# Patient Record
Sex: Male | Born: 1980 | Race: White | Hispanic: No | Marital: Married | Smoking: Current every day smoker
Health system: Southern US, Community
[De-identification: ages and names within clinical notes are randomized; demographics above are authoritative.]

## PROBLEM LIST (undated history)

## (undated) DIAGNOSIS — Q600 Renal agenesis, unilateral: Secondary | ICD-10-CM

## (undated) DIAGNOSIS — M5412 Radiculopathy, cervical region: Secondary | ICD-10-CM

## (undated) DIAGNOSIS — M542 Cervicalgia: Secondary | ICD-10-CM

## (undated) DIAGNOSIS — M5416 Radiculopathy, lumbar region: Secondary | ICD-10-CM

## (undated) DIAGNOSIS — M549 Dorsalgia, unspecified: Secondary | ICD-10-CM

## (undated) DIAGNOSIS — G8929 Other chronic pain: Secondary | ICD-10-CM

---
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
## 2002-03-27 ENCOUNTER — Emergency Department (HOSPITAL_COMMUNITY): Admission: EM | Admit: 2002-03-27 | Discharge: 2002-03-27 | Payer: Self-pay | Admitting: Emergency Medicine

## 2002-03-27 ENCOUNTER — Encounter: Payer: Self-pay | Admitting: Emergency Medicine

## 2002-03-29 ENCOUNTER — Emergency Department (HOSPITAL_COMMUNITY): Admission: EM | Admit: 2002-03-29 | Discharge: 2002-03-29 | Payer: Self-pay | Admitting: Emergency Medicine

## 2002-06-17 ENCOUNTER — Emergency Department (HOSPITAL_COMMUNITY): Admission: EM | Admit: 2002-06-17 | Discharge: 2002-06-17 | Payer: Self-pay

## 2002-07-12 ENCOUNTER — Emergency Department (HOSPITAL_COMMUNITY): Admission: EM | Admit: 2002-07-12 | Discharge: 2002-07-12 | Payer: Self-pay | Admitting: Emergency Medicine

## 2002-08-28 ENCOUNTER — Emergency Department (HOSPITAL_COMMUNITY): Admission: EM | Admit: 2002-08-28 | Discharge: 2002-08-28 | Payer: Self-pay | Admitting: *Deleted

## 2002-09-02 ENCOUNTER — Emergency Department (HOSPITAL_COMMUNITY): Admission: EM | Admit: 2002-09-02 | Discharge: 2002-09-02 | Payer: Self-pay | Admitting: Emergency Medicine

## 2003-03-30 ENCOUNTER — Emergency Department (HOSPITAL_COMMUNITY): Admission: EM | Admit: 2003-03-30 | Discharge: 2003-03-30 | Payer: Self-pay | Admitting: Emergency Medicine

## 2004-02-21 ENCOUNTER — Emergency Department (HOSPITAL_COMMUNITY): Admission: EM | Admit: 2004-02-21 | Discharge: 2004-02-21 | Payer: Self-pay | Admitting: Emergency Medicine

## 2004-02-21 ENCOUNTER — Emergency Department (HOSPITAL_COMMUNITY): Admission: EM | Admit: 2004-02-21 | Discharge: 2004-02-22 | Payer: Self-pay | Admitting: Emergency Medicine

## 2004-12-26 ENCOUNTER — Emergency Department (HOSPITAL_COMMUNITY): Admission: EM | Admit: 2004-12-26 | Discharge: 2004-12-26 | Payer: Self-pay | Admitting: Emergency Medicine

## 2005-08-02 ENCOUNTER — Emergency Department (HOSPITAL_COMMUNITY): Admission: EM | Admit: 2005-08-02 | Discharge: 2005-08-02 | Payer: Self-pay | Admitting: Emergency Medicine

## 2005-08-10 ENCOUNTER — Ambulatory Visit: Payer: Self-pay | Admitting: Family Medicine

## 2005-08-10 ENCOUNTER — Ambulatory Visit (HOSPITAL_COMMUNITY): Admission: RE | Admit: 2005-08-10 | Discharge: 2005-08-10 | Payer: Self-pay | Admitting: Family Medicine

## 2005-08-12 ENCOUNTER — Ambulatory Visit (HOSPITAL_COMMUNITY): Admission: RE | Admit: 2005-08-12 | Discharge: 2005-08-12 | Payer: Self-pay | Admitting: Family Medicine

## 2005-08-25 ENCOUNTER — Emergency Department (HOSPITAL_COMMUNITY): Admission: EM | Admit: 2005-08-25 | Discharge: 2005-08-25 | Payer: Self-pay | Admitting: Emergency Medicine

## 2006-05-05 ENCOUNTER — Emergency Department (HOSPITAL_COMMUNITY): Admission: EM | Admit: 2006-05-05 | Discharge: 2006-05-05 | Payer: Self-pay | Admitting: Emergency Medicine

## 2007-04-25 ENCOUNTER — Emergency Department (HOSPITAL_COMMUNITY): Admission: EM | Admit: 2007-04-25 | Discharge: 2007-04-25 | Payer: Self-pay | Admitting: Emergency Medicine

## 2007-08-02 ENCOUNTER — Encounter: Payer: Self-pay | Admitting: Family Medicine

## 2010-01-04 ENCOUNTER — Emergency Department (HOSPITAL_COMMUNITY): Admission: EM | Admit: 2010-01-04 | Discharge: 2010-01-04 | Payer: Self-pay | Admitting: Emergency Medicine

## 2010-01-05 ENCOUNTER — Emergency Department (HOSPITAL_COMMUNITY): Admission: EM | Admit: 2010-01-05 | Discharge: 2010-01-05 | Payer: Self-pay | Admitting: Emergency Medicine

## 2010-06-21 ENCOUNTER — Emergency Department (HOSPITAL_COMMUNITY): Admission: EM | Admit: 2010-06-21 | Discharge: 2010-06-21 | Payer: Self-pay | Admitting: Emergency Medicine

## 2010-07-16 ENCOUNTER — Emergency Department (HOSPITAL_COMMUNITY)
Admission: EM | Admit: 2010-07-16 | Discharge: 2010-07-16 | Payer: Self-pay | Source: Home / Self Care | Admitting: Emergency Medicine

## 2010-08-24 ENCOUNTER — Emergency Department (HOSPITAL_COMMUNITY)
Admission: EM | Admit: 2010-08-24 | Discharge: 2010-08-24 | Payer: Self-pay | Source: Home / Self Care | Admitting: Emergency Medicine

## 2010-08-31 NOTE — Letter (Signed)
Summary: Historic Patient File  Historic Patient File   Imported By: Lind Guest 05/03/2010 15:48:47  _____________________________________________________________________  External Attachment:    Type:   Image     Comment:   External Document

## 2010-09-08 ENCOUNTER — Emergency Department (HOSPITAL_COMMUNITY)
Admission: EM | Admit: 2010-09-08 | Discharge: 2010-09-08 | Disposition: A | Discharge status: Home / Self Care | Payer: Self-pay | Attending: Emergency Medicine | Admitting: Emergency Medicine

## 2010-09-08 DIAGNOSIS — J069 Acute upper respiratory infection, unspecified: Secondary | ICD-10-CM | POA: Insufficient documentation

## 2010-09-08 DIAGNOSIS — R197 Diarrhea, unspecified: Secondary | ICD-10-CM | POA: Insufficient documentation

## 2010-09-08 DIAGNOSIS — R05 Cough: Secondary | ICD-10-CM | POA: Insufficient documentation

## 2010-09-08 DIAGNOSIS — J3489 Other specified disorders of nose and nasal sinuses: Secondary | ICD-10-CM | POA: Insufficient documentation

## 2010-09-08 DIAGNOSIS — R5381 Other malaise: Secondary | ICD-10-CM | POA: Insufficient documentation

## 2010-09-08 DIAGNOSIS — F17200 Nicotine dependence, unspecified, uncomplicated: Secondary | ICD-10-CM | POA: Insufficient documentation

## 2010-09-08 DIAGNOSIS — R059 Cough, unspecified: Secondary | ICD-10-CM | POA: Insufficient documentation

## 2010-09-08 DIAGNOSIS — R112 Nausea with vomiting, unspecified: Secondary | ICD-10-CM | POA: Insufficient documentation

## 2010-09-08 DIAGNOSIS — IMO0002 Reserved for concepts with insufficient information to code with codable children: Secondary | ICD-10-CM | POA: Insufficient documentation

## 2010-09-08 LAB — RAPID STREP SCREEN (MED CTR MEBANE ONLY): Streptococcus, Group A Screen (Direct): NEGATIVE

## 2010-10-12 LAB — DIFFERENTIAL
Basophils Absolute: 0.1 10*3/uL (ref 0.0–0.1)
Basophils Relative: 1 % (ref 0–1)
Eosinophils Absolute: 0.3 10*3/uL (ref 0.0–0.7)
Eosinophils Relative: 4 % (ref 0–5)
Lymphocytes Relative: 31 % (ref 12–46)
Monocytes Absolute: 0.4 10*3/uL (ref 0.1–1.0)

## 2010-10-12 LAB — POCT CARDIAC MARKERS
CKMB, poc: 1.2 ng/mL (ref 1.0–8.0)
Myoglobin, poc: 58.2 ng/mL (ref 12–200)

## 2010-10-12 LAB — LIPASE, BLOOD: Lipase: 26 U/L (ref 11–59)

## 2010-10-12 LAB — CBC
HCT: 46.9 % (ref 39.0–52.0)
Hemoglobin: 16.1 g/dL (ref 13.0–17.0)
MCV: 95.6 fL (ref 78.0–100.0)
RDW: 12.6 % (ref 11.5–15.5)
WBC: 6.9 10*3/uL (ref 4.0–10.5)

## 2010-10-12 LAB — COMPREHENSIVE METABOLIC PANEL
AST: 25 U/L (ref 0–37)
BUN: 6 mg/dL (ref 6–23)
CO2: 25 mEq/L (ref 19–32)
Chloride: 107 mEq/L (ref 96–112)
Creatinine, Ser: 0.76 mg/dL (ref 0.4–1.5)
Glucose, Bld: 95 mg/dL (ref 70–99)
Potassium: 4 mEq/L (ref 3.5–5.1)

## 2010-11-25 ENCOUNTER — Emergency Department (HOSPITAL_COMMUNITY)
Admission: EM | Admit: 2010-11-25 | Discharge: 2010-11-25 | Disposition: A | Discharge status: Home / Self Care | Payer: Self-pay | Attending: Emergency Medicine | Admitting: Emergency Medicine

## 2010-11-25 DIAGNOSIS — K089 Disorder of teeth and supporting structures, unspecified: Secondary | ICD-10-CM | POA: Insufficient documentation

## 2010-12-24 ENCOUNTER — Emergency Department (HOSPITAL_COMMUNITY)
Admission: EM | Admit: 2010-12-24 | Discharge: 2010-12-24 | Disposition: A | Discharge status: Home / Self Care | Payer: Self-pay | Attending: Emergency Medicine | Admitting: Emergency Medicine

## 2010-12-24 DIAGNOSIS — Q605 Renal hypoplasia, unspecified: Secondary | ICD-10-CM | POA: Insufficient documentation

## 2010-12-24 DIAGNOSIS — K089 Disorder of teeth and supporting structures, unspecified: Secondary | ICD-10-CM | POA: Insufficient documentation

## 2010-12-24 DIAGNOSIS — Q602 Renal agenesis, unspecified: Secondary | ICD-10-CM | POA: Insufficient documentation

## 2010-12-24 DIAGNOSIS — IMO0002 Reserved for concepts with insufficient information to code with codable children: Secondary | ICD-10-CM | POA: Insufficient documentation

## 2010-12-24 DIAGNOSIS — K029 Dental caries, unspecified: Secondary | ICD-10-CM | POA: Insufficient documentation

## 2011-01-23 ENCOUNTER — Emergency Department (HOSPITAL_COMMUNITY)
Admission: EM | Admit: 2011-01-23 | Discharge: 2011-01-23 | Disposition: A | Discharge status: Home / Self Care | Payer: Self-pay | Attending: Emergency Medicine | Admitting: Emergency Medicine

## 2011-01-23 DIAGNOSIS — J40 Bronchitis, not specified as acute or chronic: Secondary | ICD-10-CM | POA: Insufficient documentation

## 2011-01-23 DIAGNOSIS — F17200 Nicotine dependence, unspecified, uncomplicated: Secondary | ICD-10-CM | POA: Insufficient documentation

## 2011-01-23 DIAGNOSIS — J029 Acute pharyngitis, unspecified: Secondary | ICD-10-CM | POA: Insufficient documentation

## 2011-06-09 ENCOUNTER — Emergency Department (HOSPITAL_COMMUNITY)
Admission: EM | Admit: 2011-06-09 | Discharge: 2011-06-09 | Disposition: A | Discharge status: Home / Self Care | Payer: Medicaid Other | Attending: Emergency Medicine | Admitting: Emergency Medicine

## 2011-06-09 ENCOUNTER — Emergency Department (HOSPITAL_COMMUNITY): Payer: Medicaid Other

## 2011-06-09 ENCOUNTER — Encounter: Payer: Self-pay | Admitting: Emergency Medicine

## 2011-06-09 DIAGNOSIS — F17200 Nicotine dependence, unspecified, uncomplicated: Secondary | ICD-10-CM | POA: Insufficient documentation

## 2011-06-09 DIAGNOSIS — R509 Fever, unspecified: Secondary | ICD-10-CM | POA: Insufficient documentation

## 2011-06-09 DIAGNOSIS — M545 Low back pain, unspecified: Secondary | ICD-10-CM | POA: Insufficient documentation

## 2011-06-09 DIAGNOSIS — S335XXA Sprain of ligaments of lumbar spine, initial encounter: Secondary | ICD-10-CM | POA: Insufficient documentation

## 2011-06-09 DIAGNOSIS — W108XXA Fall (on) (from) other stairs and steps, initial encounter: Secondary | ICD-10-CM | POA: Insufficient documentation

## 2011-06-09 DIAGNOSIS — S39012A Strain of muscle, fascia and tendon of lower back, initial encounter: Secondary | ICD-10-CM

## 2011-06-09 DIAGNOSIS — S20229A Contusion of unspecified back wall of thorax, initial encounter: Secondary | ICD-10-CM

## 2011-06-09 DIAGNOSIS — R209 Unspecified disturbances of skin sensation: Secondary | ICD-10-CM | POA: Insufficient documentation

## 2011-06-09 LAB — URINALYSIS, ROUTINE W REFLEX MICROSCOPIC
Bilirubin Urine: NEGATIVE
Ketones, ur: NEGATIVE mg/dL
Specific Gravity, Urine: >1.03 — ABNORMAL HIGH (ref 1.005–1.030)
pH: 5.5 (ref 5.0–8.0)

## 2011-06-09 MED ORDER — METHOCARBAMOL 500 MG PO TABS: ORAL_TABLET | ORAL | Status: DC

## 2011-06-09 MED ORDER — DIAZEPAM 5 MG PO TABS
10.0000 mg | ORAL_TABLET | Freq: Once | ORAL | Status: AC
  Administered 2011-06-09: 10 mg via ORAL
  Filled 2011-06-09: qty 2

## 2011-06-09 MED ORDER — HYDROCODONE-ACETAMINOPHEN 5-325 MG PO TABS
2.0000 | ORAL_TABLET | Freq: Once | ORAL | Status: AC
  Administered 2011-06-09: 2 via ORAL
  Filled 2011-06-09: qty 2

## 2011-06-09 MED ORDER — HYDROCODONE-ACETAMINOPHEN 5-325 MG PO TABS: 1.0000 | ORAL_TABLET | ORAL | Status: AC | PRN

## 2011-06-09 MED ORDER — MELOXICAM 7.5 MG PO TABS: ORAL_TABLET | ORAL | Status: DC

## 2011-06-09 NOTE — ED Notes (Signed)
Pt states fell down stairs landing on buttocks at approx 1230. Pt states pain radiates down both legs and into scrotum.

## 2011-06-09 NOTE — ED Notes (Addendum)
Lower back pain running down both legs x 2 hrs. Took ibuprofen with no relief. States missed a step and fell backwards to back area. Denies hitting head or loc. NAD

## 2011-06-09 NOTE — ED Provider Notes (Signed)
History     CSN: [id number] Arrival date & time: 06/09/2011  3:20 PM   First MD Initiated Contact with Patient 06/09/11 1541      Chief Complaint  Patient presents with  . Back Pain    (Consider location/radiation/quality/duration/timing/severity/associated sxs/prior treatment) Patient is a 30 y.o. male presenting with back pain. The history is provided by the patient.  Back Pain  This is a new problem. The current episode started 6 to 12 hours ago. The problem occurs constantly. The problem has been gradually worsening. The pain is associated with falling. The pain is present in the lumbar spine. The pain radiates to the left foot and right foot. The pain is severe. The pain is the same all the time. Associated symptoms include a fever, pelvic pain and tingling. Pertinent negatives include no chest pain, no abdominal pain, no bowel incontinence, no perianal numbness, no bladder incontinence and no dysuria. He has tried NSAIDs for the symptoms.    History reviewed. No pertinent past medical history.  History reviewed. No pertinent past surgical history.  History reviewed. No pertinent family history.  History  Substance Use Topics  . Smoking status: Current Everyday Smoker  . Smokeless tobacco: Not on file  . Alcohol Use: No      Review of Systems  Constitutional: Positive for fever. Negative for activity change.       All ROS Neg except as noted in HPI  HENT: Negative for nosebleeds and neck pain.   Eyes: Negative for photophobia and discharge.  Respiratory: Negative for cough, shortness of breath and wheezing.   Cardiovascular: Negative for chest pain and palpitations.  Gastrointestinal: Negative for abdominal pain, blood in stool and bowel incontinence.  Genitourinary: Positive for pelvic pain. Negative for bladder incontinence, dysuria, frequency and hematuria.  Musculoskeletal: Positive for back pain. Negative for arthralgias.  Skin: Negative.   Neurological:  Positive for tingling. Negative for dizziness, seizures and speech difficulty.  Psychiatric/Behavioral: Negative for hallucinations and confusion.    Allergies  Review of patient's allergies indicates no known allergies.  Home Medications   Current Outpatient Rx  Name Route Sig Dispense Refill  . IBUPROFEN 200 MG PO TABS Oral Take 1,200 mg by mouth once as needed. For pain       BP 151/96  Pulse 104  Temp(Src) 99 F (37.2 C) (Oral)  Resp 16  Ht 6' (1.829 m)  Wt 270 lb (122.471 kg)  BMI 36.62 kg/m2  SpO2 99%  Physical Exam  Nursing note and vitals reviewed. Constitutional: He is oriented to person, place, and time. He appears well-developed and well-nourished.  Non-toxic appearance.  HENT:  Head: Normocephalic.  Right Ear: Tympanic membrane and external ear normal.  Left Ear: Tympanic membrane and external ear normal.  Eyes: EOM and lids are normal. Pupils are equal, round, and reactive to light.  Neck: Normal range of motion. Neck supple. Carotid bruit is not present.  Cardiovascular: Normal rate, regular rhythm, normal heart sounds, intact distal pulses and normal pulses.   Pulmonary/Chest: Breath sounds normal. No respiratory distress.  Abdominal: Soft. Bowel sounds are normal. There is no tenderness. There is no guarding.  Musculoskeletal: Normal range of motion.  Lymphadenopathy:       Head (right side): No submandibular adenopathy present.       Head (left side): No submandibular adenopathy present.    He has no cervical adenopathy.  Neurological: He is alert and oriented to person, place, and time. He has normal strength. No cranial  nerve deficit or sensory deficit.  Skin: Skin is warm and dry.  Psychiatric: He has a normal mood and affect. His speech is normal.    ED Course: Pain improving after pain meds. 5:22 made aware that the urine had been collected but not sent to the lab. Results pending. Test results give to patient. Pt states while sitting he is ok, but  moving is very uncomfortable. Pt to alternate heat and ice. Use crutches. Rx for Mobic, Robaxin and Norco given. Pt to f/u with Dr Hilda Lias if not improving.  Procedures (including critical care time)  Labs Reviewed - No data to display No results found.   Dx: Contusion of lower back   2. Lumbar strain.   MDM  I have reviewed nursing notes, vital signs, and all appropriate lab and imaging results for this patient.  Results for orders placed during the hospital encounter of 06/09/11  URINALYSIS, ROUTINE W REFLEX MICROSCOPIC      Component Value Range   Color, Urine YELLOW  YELLOW    Appearance CLEAR  CLEAR    Specific Gravity, Urine >1.030 (*) 1.005 - 1.030    pH 5.5  5.0 - 8.0    Glucose, UA NEGATIVE  NEGATIVE (mg/dL)   Hgb urine dipstick NEGATIVE  NEGATIVE    Bilirubin Urine NEGATIVE  NEGATIVE    Ketones, ur NEGATIVE  NEGATIVE (mg/dL)   Protein, ur NEGATIVE  NEGATIVE (mg/dL)   Urobilinogen, UA 1.0  0.0 - 1.0 (mg/dL)   Nitrite NEGATIVE  NEGATIVE    Leukocytes, UA NEGATIVE  NEGATIVE    Dg Lumbar Spine Complete  06/09/2011  *RADIOLOGY REPORT*  Clinical Data: History of injury from fall with low back pain.  LUMBAR SPINE - COMPLETE 4+ VIEW  Comparison: 04/06/2011.  Findings: There are five non-rib bearing lumbar-type vertebral bodies.  These are labeled L1-L5.  There is slight narrowing of the intervertebral disc space at L5-S1.  No fracture or subluxation is seen.  No pars defect is evident.  Minimal beginning degenerative spondylosis spurring is seen involving the anterior inferior aspect of the body of L5.  SI joints appear intact.  IMPRESSION: Minimal beginning degenerative spondylosis at L5-S1.  No fracture or subluxation evident.  Original Report Authenticated By: Crawford Givens, M.D.   Dg Sacrum/coccyx  06/09/2011  *RADIOLOGY REPORT*  Clinical Data: History of injury from fall with pain.  SACRUM AND COCCYX - 2+ VIEW  Comparison: None.  Findings: There is minimal degenerative  spondylosis.  Small phleboliths are seen.  SI joints appear intact. Alignment is normal.  Joint spaces are preserved.  No fracture or dislocation is evident.  No soft tissue lesions are seen.  IMPRESSION: No sacral or coccygeal fracture is evident.  Original Report Authenticated By: Crawford Givens, M.D.         Kathie Dike, [place] 06/09/11 [phone]

## 2011-06-10 NOTE — ED Provider Notes (Signed)
Medical screening examination/treatment/procedure(s) were performed by non-physician practitioner and as supervising physician I was immediately available for consultation/collaboration.  Flint Melter, MD 06/10/11 [phone]

## 2011-11-01 ENCOUNTER — Other Ambulatory Visit (HOSPITAL_COMMUNITY): Payer: Self-pay | Admitting: Neurosurgery

## 2011-11-01 DIAGNOSIS — M48061 Spinal stenosis, lumbar region without neurogenic claudication: Secondary | ICD-10-CM

## 2011-11-04 ENCOUNTER — Inpatient Hospital Stay (HOSPITAL_COMMUNITY): Admission: RE | Admit: 2011-11-04 | Payer: Medicaid Other | Source: Ambulatory Visit

## 2012-06-16 ENCOUNTER — Emergency Department (HOSPITAL_COMMUNITY)
Admission: EM | Admit: 2012-06-16 | Discharge: 2012-06-16 | Disposition: A | Discharge status: Home / Self Care | Payer: Medicaid Other | Attending: Emergency Medicine | Admitting: Emergency Medicine

## 2012-06-16 ENCOUNTER — Encounter (HOSPITAL_COMMUNITY): Payer: Self-pay

## 2012-06-16 DIAGNOSIS — K029 Dental caries, unspecified: Secondary | ICD-10-CM | POA: Insufficient documentation

## 2012-06-16 DIAGNOSIS — Q602 Renal agenesis, unspecified: Secondary | ICD-10-CM | POA: Insufficient documentation

## 2012-06-16 DIAGNOSIS — H9202 Otalgia, left ear: Secondary | ICD-10-CM

## 2012-06-16 DIAGNOSIS — H9209 Otalgia, unspecified ear: Secondary | ICD-10-CM | POA: Insufficient documentation

## 2012-06-16 DIAGNOSIS — F17200 Nicotine dependence, unspecified, uncomplicated: Secondary | ICD-10-CM | POA: Insufficient documentation

## 2012-06-16 DIAGNOSIS — Q605 Renal hypoplasia, unspecified: Secondary | ICD-10-CM | POA: Insufficient documentation

## 2012-06-16 HISTORY — DX: Renal agenesis, unilateral: Q60.0

## 2012-06-16 MED ORDER — HYDROCODONE-ACETAMINOPHEN 5-325 MG PO TABS: 1.0000 | ORAL_TABLET | ORAL | Status: AC | PRN

## 2012-06-16 MED ORDER — AMOXICILLIN 500 MG PO CAPS: 500.0000 mg | ORAL_CAPSULE | Freq: Three times a day (TID) | ORAL | Status: AC

## 2012-06-16 MED ORDER — HYDROCODONE-ACETAMINOPHEN 5-325 MG PO TABS
1.0000 | ORAL_TABLET | Freq: Once | ORAL | Status: AC
  Administered 2012-06-16: 1 via ORAL
  Filled 2012-06-16: qty 1

## 2012-06-16 MED ORDER — AMOXICILLIN 250 MG PO CAPS
500.0000 mg | ORAL_CAPSULE | Freq: Once | ORAL | Status: AC
  Administered 2012-06-16: 500 mg via ORAL
  Filled 2012-06-16 (×2): qty 1

## 2012-06-16 NOTE — ED Notes (Signed)
Left ear ache, started around 0400 this morning per pt.

## 2012-06-17 NOTE — ED Provider Notes (Signed)
History     CSN: [id number]  Arrival date & time 06/16/12  Ernestina Columbia   First MD Initiated Contact with Patient 06/16/12 1931      Chief Complaint  Patient presents with  . Otalgia    (Consider location/radiation/quality/duration/timing/severity/associated sxs/prior treatment) HPI Comments: COLLIN CLARA woke around 4 am today with pain in and around his left ear.  He denies fevers, chills, dizziness, decreased hearing acuity, drainage from and injury to the ear.  He also denies nasal congestion, sore throat and dental pain,  Although does have several decayed teeth that needs to be pulled.  He has taken ibuprofen without relief of his symptoms.  The history is provided by the patient.    Past Medical History  Diagnosis Date  . Congenital absence of one kidney     History reviewed. No pertinent past surgical history.  No family history on file.  History  Substance Use Topics  . Smoking status: Current Every Day Smoker  . Smokeless tobacco: Not on file  . Alcohol Use: No      Review of Systems  Constitutional: Negative for fever.  HENT: Positive for ear pain and dental problem. Negative for congestion, sore throat, facial swelling, rhinorrhea, neck pain, neck stiffness and sinus pressure.   Respiratory: Negative for shortness of breath.     Allergies  Review of patient's allergies indicates no known allergies.  Home Medications   Current Outpatient Rx  Name  Route  Sig  Dispense  Refill  . IBUPROFEN 800 MG PO TABS   Oral   Take 800 mg by mouth every 8 (eight) hours as needed. pain         . AMOXICILLIN 500 MG PO CAPS   Oral   Take 1 capsule (500 mg total) by mouth 3 (three) times daily.   30 capsule   0   . HYDROCODONE-ACETAMINOPHEN 5-325 MG PO TABS   Oral   Take 1 tablet by mouth every 4 (four) hours as needed for pain.   15 tablet   0   . MELOXICAM 7.5 MG PO TABS      1 po bid with food   12 tablet   0   . METHOCARBAMOL 500 MG PO TABS      2  po bid with food   28 tablet   0     BP 145/85  Pulse 88  Temp 98.1 F (36.7 C) (Oral)  Resp 18  Ht 6' (1.829 m)  Wt 280 lb (127.007 kg)  BMI 37.97 kg/m2  SpO2 100%  Physical Exam  Constitutional: He is oriented to person, place, and time. He appears well-developed and well-nourished. No distress.  HENT:  Head: Normocephalic and atraumatic.  Right Ear: Tympanic membrane, external ear and ear canal normal. No tenderness. No mastoid tenderness. Tympanic membrane is not erythematous and not bulging.  Left Ear: Tympanic membrane, external ear and ear canal normal. No tenderness. No mastoid tenderness. Tympanic membrane is not erythematous and not bulging.  Mouth/Throat: Oropharynx is clear and moist and mucous membranes are normal. No oral lesions.    Eyes: Conjunctivae normal are normal.  Neck: Normal range of motion. Neck supple.  Cardiovascular: Normal rate and normal heart sounds.   Pulmonary/Chest: Effort normal.  Abdominal: He exhibits no distension.  Musculoskeletal: Normal range of motion.  Lymphadenopathy:    He has no cervical adenopathy.  Neurological: He is alert and oriented to person, place, and time.  Skin: Skin is warm and dry.  No erythema.  Psychiatric: He has a normal mood and affect.    ED Course  Procedures (including critical care time)  Labs Reviewed - No data to display No results found.   1. Earache on left   2. Dental decay       MDM  Ear exam is normal.  Suspect pain is referred from left lower molar.  Will cover with amoxil, hydrocodone prescribed.  Dental referral given.        Burgess Amor, PA 06/17/12 1356  Burgess Amor, [place] 06/17/12 1357

## 2012-06-17 NOTE — ED Provider Notes (Signed)
Medical screening examination/treatment/procedure(s) were performed by non-physician practitioner and as supervising physician I was immediately available for consultation/collaboration.   Benny Lennert, MD 06/17/12 [phone]

## 2012-08-14 ENCOUNTER — Encounter (HOSPITAL_COMMUNITY): Payer: Self-pay | Admitting: *Deleted

## 2012-08-14 ENCOUNTER — Emergency Department (HOSPITAL_COMMUNITY)
Admission: EM | Admit: 2012-08-14 | Discharge: 2012-08-14 | Disposition: A | Discharge status: Home / Self Care | Payer: Medicaid Other | Attending: Emergency Medicine | Admitting: Emergency Medicine

## 2012-08-14 DIAGNOSIS — K0889 Other specified disorders of teeth and supporting structures: Secondary | ICD-10-CM

## 2012-08-14 DIAGNOSIS — R6884 Jaw pain: Secondary | ICD-10-CM | POA: Insufficient documentation

## 2012-08-14 DIAGNOSIS — Q602 Renal agenesis, unspecified: Secondary | ICD-10-CM | POA: Insufficient documentation

## 2012-08-14 DIAGNOSIS — K089 Disorder of teeth and supporting structures, unspecified: Secondary | ICD-10-CM | POA: Insufficient documentation

## 2012-08-14 DIAGNOSIS — Q605 Renal hypoplasia, unspecified: Secondary | ICD-10-CM | POA: Insufficient documentation

## 2012-08-14 DIAGNOSIS — F17200 Nicotine dependence, unspecified, uncomplicated: Secondary | ICD-10-CM | POA: Insufficient documentation

## 2012-08-14 MED ORDER — TRAMADOL HCL 50 MG PO TABS
100.0000 mg | ORAL_TABLET | Freq: Four times a day (QID) | ORAL | Status: DC
  Administered 2012-08-14: 100 mg via ORAL
  Filled 2012-08-14: qty 2

## 2012-08-14 MED ORDER — IBUPROFEN 800 MG PO TABS
800.0000 mg | ORAL_TABLET | Freq: Once | ORAL | Status: AC
  Administered 2012-08-14: 800 mg via ORAL
  Filled 2012-08-14: qty 1

## 2012-08-14 MED ORDER — TRAMADOL HCL 50 MG PO TABS: ORAL_TABLET | ORAL | Status: DC

## 2012-08-14 MED ORDER — PENICILLIN V POTASSIUM 500 MG PO TABS: 500.0000 mg | ORAL_TABLET | Freq: Four times a day (QID) | ORAL | Status: AC

## 2012-08-14 MED ORDER — PENICILLIN V POTASSIUM 250 MG PO TABS
500.0000 mg | ORAL_TABLET | Freq: Once | ORAL | Status: AC
  Administered 2012-08-14: 500 mg via ORAL
  Filled 2012-08-14: qty 2

## 2012-08-14 NOTE — ED Provider Notes (Signed)
History     CSN: [id number]  Arrival date & time 08/14/12  1314   First MD Initiated Contact with Patient 08/14/12 1343      Chief Complaint  Patient presents with  . Dental Pain    (Consider location/radiation/quality/duration/timing/severity/associated sxs/prior treatment) HPI Comments: Has no dentist.  Patient is a 32 y.o. male presenting with tooth pain. The history is provided by the patient. No language interpreter was used.  Dental PainPrimary symptoms do not include dental injury or fever. Episode onset: began hurting while eating dinner 2 nights ago. The symptoms are unchanged. The symptoms occur constantly.  Additional symptoms include: jaw pain. Additional symptoms do not include: purulent gums, trismus and facial swelling.    Past Medical History  Diagnosis Date  . Congenital absence of one kidney     History reviewed. No pertinent past surgical history.  History reviewed. No pertinent family history.  History  Substance Use Topics  . Smoking status: Current Every Day Smoker  . Smokeless tobacco: Not on file  . Alcohol Use: No      Review of Systems  Constitutional: Negative for fever and chills.  HENT: Positive for dental problem. Negative for facial swelling.   All other systems reviewed and are negative.    Allergies  Hydrocodone  Home Medications   Current Outpatient Rx  Name  Route  Sig  Dispense  Refill  . IBUPROFEN 200 MG PO TABS   Oral   Take 800 mg by mouth every 6 (six) hours as needed. Pain         . PENICILLIN V POTASSIUM 500 MG PO TABS   Oral   Take 1 tablet (500 mg total) by mouth 4 (four) times daily.   40 tablet   0   . TRAMADOL HCL 50 MG PO TABS      2 tabs po TID prn pain   30 tablet   0     BP 142/99  Pulse 80  Temp 98.7 F (37.1 C) (Oral)  Resp 18  Ht 6' (1.829 m)  Wt 280 lb (127.007 kg)  BMI 37.97 kg/m2  SpO2 96%  Physical Exam  Nursing note and vitals reviewed. Constitutional: He is oriented to  person, place, and time. He appears well-developed and well-nourished.  HENT:  Head: Normocephalic and atraumatic.  Mouth/Throat: Uvula is midline, oropharynx is clear and moist and mucous membranes are normal. Dental caries present. No uvula swelling.    Eyes: EOM are normal.  Neck: Normal range of motion.  Cardiovascular: Normal rate, regular rhythm and intact distal pulses.   Pulmonary/Chest: Effort normal. No respiratory distress.  Abdominal: Soft. He exhibits no distension. There is no tenderness.  Musculoskeletal: Normal range of motion.  Neurological: He is alert and oriented to person, place, and time.  Skin: Skin is warm and dry.  Psychiatric: He has a normal mood and affect. Judgment normal.    ED Course  Procedures (including critical care time)  Labs Reviewed - No data to display No results found.   1. Pain, dental       MDM  rx-tramadol, 30 Ibuprofen rxpen VK 500 mg QID, 40 F/u with dentist ASAP        Evalina Field, PA 08/14/12 1452  Evalina Field, PA 08/14/12 1453

## 2012-08-14 NOTE — ED Notes (Signed)
Dental pain rt mandibular molar.for 2 days

## 2012-08-14 NOTE — Discharge Instructions (Signed)
Dental Pain A tooth ache may be caused by cavities (tooth decay). Cavities expose the nerve of the tooth to air and hot or cold temperatures. It may come from an infection or abscess (also called a boil or furuncle) around your tooth. It is also often caused by dental caries (tooth decay). This causes the pain you are having. DIAGNOSIS  Your caregiver can diagnose this problem by exam. TREATMENT   If caused by an infection, it may be treated with medications which kill germs (antibiotics) and pain medications as prescribed by your caregiver. Take medications as directed.  Only take over-the-counter or prescription medicines for pain, discomfort, or fever as directed by your caregiver.  Whether the tooth ache today is caused by infection or dental disease, you should see your dentist as soon as possible for further care. SEEK MEDICAL CARE IF: The exam and treatment you received today has been provided on an emergency basis only. This is not a substitute for complete medical or dental care. If your problem worsens or new problems (symptoms) appear, and you are unable to meet with your dentist, call or return to this location. SEEK IMMEDIATE MEDICAL CARE IF:   You have a fever.  You develop redness and swelling of your face, jaw, or neck.  You are unable to open your mouth.  You have severe pain uncontrolled by pain medicine. MAKE SURE YOU:   Understand these instructions.  Will watch your condition.  Will get help right away if you are not doing well or get worse. Document Released: 07/18/2005 Document Revised: 10/10/2011 Document Reviewed: 03/05/2008 [facility] Patient Information 2013 [place], [place].   Take the meds as directed.  Take ibuprofen 800 mg every 8 hrs  With food.  Follow up with the dentist of your choice.

## 2012-08-15 NOTE — ED Provider Notes (Signed)
Medical screening examination/treatment/procedure(s) were performed by non-physician practitioner and as supervising physician I was immediately available for consultation/collaboration.   Charles B. Sheldon, MD 08/15/12 0851 

## 2013-03-01 ENCOUNTER — Encounter (HOSPITAL_COMMUNITY): Payer: Self-pay

## 2013-03-01 ENCOUNTER — Emergency Department (HOSPITAL_COMMUNITY)
Admission: EM | Admit: 2013-03-01 | Discharge: 2013-03-02 | Disposition: A | Discharge status: Home / Self Care | Payer: Medicaid Other | Attending: Emergency Medicine | Admitting: Emergency Medicine

## 2013-03-01 DIAGNOSIS — Q605 Renal hypoplasia, unspecified: Secondary | ICD-10-CM | POA: Insufficient documentation

## 2013-03-01 DIAGNOSIS — R112 Nausea with vomiting, unspecified: Secondary | ICD-10-CM | POA: Insufficient documentation

## 2013-03-01 DIAGNOSIS — R42 Dizziness and giddiness: Secondary | ICD-10-CM | POA: Insufficient documentation

## 2013-03-01 DIAGNOSIS — Q602 Renal agenesis, unspecified: Secondary | ICD-10-CM | POA: Insufficient documentation

## 2013-03-01 DIAGNOSIS — F17200 Nicotine dependence, unspecified, uncomplicated: Secondary | ICD-10-CM | POA: Insufficient documentation

## 2013-03-01 DIAGNOSIS — R109 Unspecified abdominal pain: Secondary | ICD-10-CM

## 2013-03-01 LAB — URINALYSIS, ROUTINE W REFLEX MICROSCOPIC
Leukocytes, UA: NEGATIVE
Nitrite: NEGATIVE
Protein, ur: NEGATIVE mg/dL
Urobilinogen, UA: 0.2 mg/dL (ref 0.0–1.0)

## 2013-03-01 LAB — CBC WITH DIFFERENTIAL/PLATELET
Basophils Absolute: 0.1 10*3/uL (ref 0.0–0.1)
Eosinophils Relative: 3 % (ref 0–5)
Lymphocytes Relative: 24 % (ref 12–46)
Neutro Abs: 8.4 10*3/uL — ABNORMAL HIGH (ref 1.7–7.7)
Platelets: 241 10*3/uL (ref 150–400)
RDW: 12.6 % (ref 11.5–15.5)
WBC: 12.3 10*3/uL — ABNORMAL HIGH (ref 4.0–10.5)

## 2013-03-01 MED ORDER — MORPHINE SULFATE 4 MG/ML IJ SOLN
4.0000 mg | Freq: Once | INTRAMUSCULAR | Status: AC
  Administered 2013-03-01: 4 mg via INTRAVENOUS
  Filled 2013-03-01: qty 1

## 2013-03-01 MED ORDER — ONDANSETRON HCL 4 MG/2ML IJ SOLN
4.0000 mg | Freq: Once | INTRAMUSCULAR | Status: AC
  Administered 2013-03-01: 4 mg via INTRAVENOUS
  Filled 2013-03-01: qty 2

## 2013-03-01 NOTE — ED Provider Notes (Deleted)
  CSN: [id number]     Arrival date & time 03/01/13  2203 History     First MD Initiated Contact with Patient 03/01/13 2241     Chief Complaint  Patient presents with  . Flank Pain  . Dizziness  . Emesis   (Consider location/radiation/quality/duration/timing/severity/associated sxs/prior Treatment) HPI Comments: RUTILIO YELLOWHAIR is a 32 y.o. Male presenting with left upper quadrant pain, intermittent episodes of lightheadedness, chills followed by hot flushes and nausea with only one episode of emesis(last night).  He reports drinking multiple shots of tequila,  Then took an herbal male enhancement product called Stamina RX last night, with symptoms starting about 1 hour later. He denies chest pain and shortness of breath,  Although he has discomfort in his LUQ with deep inspiration.  He has been able to tolerate PO intake today without worsened symptoms.  He denies diarrhea, hematemesis, dysuria,  Flank or back pain.  He has taken no medicines for relief of symptoms.  He has taken herbal male enhancers in the past but never this brand.  He does not know the active ingredients.     The history is provided by the patient.    Past Medical History  Diagnosis Date  . Congenital absence of one kidney    History reviewed. No pertinent past surgical history. History reviewed. No pertinent family history. History  Substance Use Topics  . Smoking status: Current Every Day Smoker  . Smokeless tobacco: Not on file  . Alcohol Use: No    Review of Systems  Allergies  Hydrocodone and Tramadol  Home Medications   Current Outpatient Rx  Name  Route  Sig  Dispense  Refill  . OVER THE COUNTER MEDICATION   Oral   Take 2 tablets by mouth once as needed (STAMINA RX:).         Marland Kitchen OVER THE COUNTER MEDICATION   Oral   Take 1 capsule by mouth daily as needed (for supplement (Multivitamin-Chinese Remedy)).          BP 131/66  Pulse 101  Temp(Src) 98.7 F (37.1 C) (Oral)  Resp 16  Ht 6'  (1.829 m)  Wt 280 lb (127.007 kg)  BMI 37.97 kg/m2  SpO2 97% Physical Exam  ED Course   Procedures (including critical care time)  Labs Reviewed  URINALYSIS, ROUTINE W REFLEX MICROSCOPIC - Abnormal; Notable for the following:    Specific Gravity, Urine >1.030 (*)    Bilirubin Urine SMALL (*)    All other components within normal limits  CBC WITH DIFFERENTIAL  COMPREHENSIVE METABOLIC PANEL  LIPASE, BLOOD   No results found. No diagnosis found.  MDM  Per the website for Stamina Rx,  Ingredients include Gena Fray Extract,Cnidium Extract, Yohimbe Extract.  Main complaints of side effects per customer complaint include headache and nausea.   Patients labs and/or radiological studies were viewed and considered during the medical decision making and disposition process. Pt discussed with Dr. Rubin Payor.  Ct abd/pelvis ordered to further eval RUQ pain,  ? Early pancreatitis despite normal lipase. Dr. Rubin Payor will follow pt pending this study.  Burgess Amor, PA-C 03/02/13 0106

## 2013-03-01 NOTE — ED Notes (Signed)
Pt is unable to urinate at this time urinal placed in room for pt. Johnny Shepherd

## 2013-03-01 NOTE — ED Notes (Signed)
Sharp pain in my left side, hard to breathe at times, was vomiting last night per pt. Feel dizzy headed at times.

## 2013-03-01 NOTE — ED Notes (Addendum)
Pt states he started taking Stamina RX and a chinese supplement, started having pain after starting these medications.

## 2013-03-02 ENCOUNTER — Emergency Department (HOSPITAL_COMMUNITY): Payer: Medicaid Other

## 2013-03-02 LAB — COMPREHENSIVE METABOLIC PANEL
ALT: 52 U/L (ref 0–53)
AST: 33 U/L (ref 0–37)
CO2: 25 mEq/L (ref 19–32)
Calcium: 9.3 mg/dL (ref 8.4–10.5)
GFR calc non Af Amer: >90 mL/min (ref >90)
Sodium: 137 mEq/L (ref 135–145)

## 2013-03-02 MED ORDER — SODIUM CHLORIDE 0.9 % IV BOLUS (SEPSIS)
1000.0000 mL | Freq: Once | INTRAVENOUS | Status: AC
  Administered 2013-03-02: 1000 mL via INTRAVENOUS

## 2013-03-02 MED ORDER — IOHEXOL 300 MG/ML  SOLN
50.0000 mL | Freq: Once | INTRAMUSCULAR | Status: AC | PRN
  Administered 2013-03-02: 50 mL via ORAL

## 2013-03-02 MED ORDER — IOHEXOL 300 MG/ML  SOLN
100.0000 mL | Freq: Once | INTRAMUSCULAR | Status: AC | PRN
  Administered 2013-03-02: 100 mL via INTRAVENOUS

## 2013-03-02 NOTE — ED Provider Notes (Signed)
Medical screening examination/treatment/procedure(s) were conducted as a shared visit with non-physician practitioner(s) and myself.  I personally evaluated the patient during the encounter. Patient with abdominal flank pain. CT reassuring except for panic mass the patient will follow. Will be discharged home  Juliet Rude. Rubin Payor, MD 03/02/13 [phone]

## 2013-03-02 NOTE — ED Notes (Signed)
Discharge instructions reviewed with pt, questions answered. Pt verbalized understanding.  

## 2013-03-08 NOTE — ED Provider Notes (Signed)
CSN: [id number]     Arrival date & time 03/01/13  2203 History     First MD Initiated Contact with Patient 03/01/13 2241     Chief Complaint  Patient presents with  . Flank Pain  . Dizziness  . Emesis   (Consider location/radiation/quality/duration/timing/severity/associated sxs/prior Treatment) HPI Comments: Johnny Shepherd is a 32 y.o. Male presenting with left upper quadrant pain, intermittent episodes of lightheadedness, chills followed by hot flushes and nausea with only one episode of emesis(last night).  He reports drinking multiple shots of tequila, Then took an herbal male enhancement product called Stamina RX last night, with symptoms starting about 1 hour later. He denies chest pain and shortness of breath,  Although he has discomfort in his LUQ with deep inspiration.  He has been able to tolerate PO intake today without worsened symptoms.  He denies diarrhea, hematemesis, dysuria,  Flank or back pain.  He has taken no medicines for relief of symptoms.  He has taken herbal male enhancers in the past but never this brand.  He does not know the active ingredients.   The history is provided by the patient.    Past Medical History  Diagnosis Date  . Congenital absence of one kidney    History reviewed. No pertinent past surgical history. History reviewed. No pertinent family history. History  Substance Use Topics  . Smoking status: Current Every Day Smoker  . Smokeless tobacco: Not on file  . Alcohol Use: No    Review of Systems  Constitutional: Negative for fever and chills.       Flushing   HENT: Negative for congestion, sore throat and neck pain.   Eyes: Negative.   Respiratory: Negative for cough, chest tightness, shortness of breath and wheezing.   Cardiovascular: Negative for chest pain.  Gastrointestinal: Positive for nausea, vomiting and abdominal pain.  Genitourinary: Negative.   Musculoskeletal: Negative for joint swelling and arthralgias.  Skin: Negative.   Negative for rash and wound.  Neurological: Negative for dizziness, weakness, light-headedness, numbness and headaches.  Psychiatric/Behavioral: Negative.     Allergies  Hydrocodone and Tramadol  Home Medications   Current Outpatient Rx  Name  Route  Sig  Dispense  Refill  . OVER THE COUNTER MEDICATION   Oral   Take 2 tablets by mouth once as needed (STAMINA RX:).         Marland Kitchen OVER THE COUNTER MEDICATION   Oral   Take 1 capsule by mouth daily as needed (for supplement (Multivitamin-Chinese Remedy)).          BP 138/64  Pulse 82  Temp(Src) 98.7 F (37.1 C) (Oral)  Resp 20  Ht 6' (1.829 m)  Wt 280 lb (127.007 kg)  BMI 37.97 kg/m2  SpO2 96% Physical Exam  Nursing note and vitals reviewed. Constitutional: He appears well-developed and well-nourished.  HENT:  Head: Normocephalic and atraumatic.  Eyes: Conjunctivae are normal.  Neck: Normal range of motion.  Cardiovascular: Normal rate, regular rhythm, normal heart sounds and intact distal pulses.   Pulmonary/Chest: Effort normal and breath sounds normal. He has no wheezes.  Abdominal: Soft. Bowel sounds are normal. There is tenderness in the left upper quadrant. There is no rebound, no guarding and no CVA tenderness.  Musculoskeletal: Normal range of motion.  Neurological: He is alert.  Skin: Skin is warm and dry.  Psychiatric: He has a normal mood and affect.    ED Course   Procedures (including critical care time)  Labs Reviewed  URINALYSIS,  ROUTINE W REFLEX MICROSCOPIC - Abnormal; Notable for the following:    Specific Gravity, Urine >1.030 (*)    Bilirubin Urine SMALL (*)    All other components within normal limits  CBC WITH DIFFERENTIAL - Abnormal; Notable for the following:    WBC 12.3 (*)    Hemoglobin 18.3 (*)    MCH 34.2 (*)    MCHC 37.0 (*)    Neutro Abs 8.4 (*)    All other components within normal limits  COMPREHENSIVE METABOLIC PANEL - Abnormal; Notable for the following:    Potassium 3.3 (*)     Glucose, Bld 172 (*)    All other components within normal limits  LIPASE, BLOOD   No results found. 1. Flank pain   2. Nausea and vomiting     MDM  Per the website for Stamina Rx, Ingredients include Gena Fray Extract,Cnidium Extract, Yohimbe Extract. Main complaints of side effects per customer complaint include headache and nausea.  Patients labs and/or radiological studies were viewed and considered during the medical decision making and disposition process.  Pt discussed with Dr. Rubin Payor. Ct abd/pelvis ordered to further eval RUQ pain, ? Early pancreatitis despite normal lipase. Dr. Rubin Payor will follow pt pending this study.   Burgess Amor, PA-C 03/08/13 1725

## 2013-03-11 NOTE — ED Provider Notes (Signed)
Medical screening examination/treatment/procedure(s) were performed by non-physician practitioner and as supervising physician I was immediately available for consultation/collaboration.  Livio Ledwith R. Mattheu Brodersen, MD 03/11/13 1210 

## 2013-04-17 ENCOUNTER — Encounter (HOSPITAL_COMMUNITY): Payer: Self-pay

## 2013-04-17 ENCOUNTER — Emergency Department (HOSPITAL_COMMUNITY)
Admission: EM | Admit: 2013-04-17 | Discharge: 2013-04-17 | Disposition: A | Discharge status: Home / Self Care | Payer: Medicaid Other | Attending: Emergency Medicine | Admitting: Emergency Medicine

## 2013-04-17 DIAGNOSIS — M542 Cervicalgia: Secondary | ICD-10-CM | POA: Insufficient documentation

## 2013-04-17 DIAGNOSIS — H9202 Otalgia, left ear: Secondary | ICD-10-CM

## 2013-04-17 DIAGNOSIS — H9209 Otalgia, unspecified ear: Secondary | ICD-10-CM | POA: Insufficient documentation

## 2013-04-17 DIAGNOSIS — R51 Headache: Secondary | ICD-10-CM | POA: Insufficient documentation

## 2013-04-17 DIAGNOSIS — Z905 Acquired absence of kidney: Secondary | ICD-10-CM | POA: Insufficient documentation

## 2013-04-17 DIAGNOSIS — F17200 Nicotine dependence, unspecified, uncomplicated: Secondary | ICD-10-CM | POA: Insufficient documentation

## 2013-04-17 MED ORDER — KETOROLAC TROMETHAMINE 60 MG/2ML IM SOLN
30.0000 mg | Freq: Once | INTRAMUSCULAR | Status: AC
  Administered 2013-04-17: 30 mg via INTRAMUSCULAR
  Filled 2013-04-17: qty 2

## 2013-04-17 NOTE — ED Notes (Signed)
Pt reports left earache and headache since yesterday.

## 2013-04-17 NOTE — ED Provider Notes (Signed)
CSN: [id number]     Arrival date & time 04/17/13  0708 History  This chart was scribed for Johnny Churn, MD by Quintella Reichert, ED scribe.  This patient was seen in room APA11/APA11 and the patient's care was started at 7:27 AM.   Chief Complaint  Patient presents with  . Otalgia  . Headache    Patient is a 32 y.o. male presenting with ear pain. The history is provided by the patient. No language interpreter was used.  Otalgia Location:  Left Behind ear:  No abnormality Severity:  Moderate Onset quality:  Gradual Duration: "a few days" Timing:  Constant Progression:  Worsening Chronicity:  New Worsened by:  Coughing Ineffective treatments: ibuprofen. Associated symptoms: headaches (left-sided) and neck pain (left-sided)   Associated symptoms: no congestion, no diarrhea, no fever, no sore throat and no vomiting   Risk factors: no chronic ear infection     HPI Comments: Johnny Shepherd is a 32 y.o. male with no pertinent medical history who presents to the Emergency Department complaining of constant, gradual-onset, progressively-worsening left ear pain with associated headache and neck pain.  Pt states that his left ear has been mildly painful for "a few days" and has felt "stopped up."  Last night his pain grew more severe and spread to the left side of his neck.  He attempted to treat pain with ibuprofen, but his pain continued to grow more severe throughout the night.  Early this morning his pain spread to the left side of his head.  Pt also states he has been coughing some and "when I do it feels like my head's gonna explode."  He denies recent illness.  He denies fever, congestion, sore throat, difficulty swallowing, difficulty breathing, vomiting, diarrhea, or dental pain.     Past Medical History  Diagnosis Date  . Congenital absence of one kidney    History reviewed. No pertinent past surgical history.  No family history on file.  History  Substance Use Topics  .  Smoking status: Current Every Day Smoker    Types: Cigarettes  . Smokeless tobacco: Not on file  . Alcohol Use: No     Review of Systems  Constitutional: Negative for fever.  HENT: Positive for ear pain and neck pain (left-sided). Negative for congestion, sore throat, trouble swallowing and dental problem.   Respiratory: Negative for shortness of breath.   Gastrointestinal: Negative for vomiting and diarrhea.  Neurological: Positive for headaches (left-sided).  All other systems reviewed and are negative.     Allergies  Hydrocodone and Tramadol  Home Medications   Current Outpatient Rx  Name  Route  Sig  Dispense  Refill  . OVER THE COUNTER MEDICATION   Oral   Take 2 tablets by mouth once as needed (STAMINA RX:).         Johnny Shepherd OVER THE COUNTER MEDICATION   Oral   Take 1 capsule by mouth daily as needed (for supplement (Multivitamin-Chinese Remedy)).          BP 129/88  Pulse 69  Temp(Src) 97.7 F (36.5 C) (Oral)  Resp 20  Ht 6' (1.829 m)  Wt 280 lb (127.007 kg)  BMI 37.97 kg/m2  SpO2 99%  Physical Exam  Nursing note and vitals reviewed. Constitutional: He is oriented to person, place, and time. He appears well-developed and well-nourished. No distress.  HENT:  Head: Normocephalic and atraumatic.  Right Ear: Tympanic membrane and ear canal normal.  Left Ear: Tympanic membrane and ear canal  normal.  Mouth/Throat: Oropharynx is clear and moist and mucous membranes are normal. No oral lesions. No trismus in the jaw.  No dental tenderness  Eyes: Conjunctivae are normal. No scleral icterus.  Neck: Neck supple.  Cardiovascular: Normal rate, regular rhythm, normal heart sounds and intact distal pulses.   No murmur heard. Pulmonary/Chest: Effort normal and breath sounds normal. No stridor. No respiratory distress. He has no wheezes. He has no rales.  Abdominal: Normal appearance. He exhibits no distension.  Neurological: He is alert and oriented to person, place, and  time.  Skin: Skin is warm and dry. No rash noted.  Psychiatric: He has a normal mood and affect. His behavior is normal.    ED Course  Procedures (including critical care time)  DIAGNOSTIC STUDIES: Oxygen Saturation is 99% on room air, normal by my interpretation.    COORDINATION OF CARE: 7:34 AM-Informed pt that symptoms are likely due to self-limited infection.  Discussed treatment plan which includes ibuprofen, Tylenol, cold packs, decongestants, and proper hydration with pt at bedside and pt agreed to plan.    Labs Review Labs Reviewed - No data to display  Imaging Review No results found.  MDM   1. Otalgia, left    32 year old male with left-sided hearing facial pain. Nontoxic, no distress, normal vitals. Ear and facial exam unremarkable. Symptoms likely from viral upper extremity infection. Advised supportive care. No signs of bacterial infection. No signs of meningitis. Ketorolac IM for pain.    I personally performed the services described in this documentation, which was scribed in my presence. The recorded information has been reviewed and is accurate.     Johnny Churn, MD 04/17/13 [phone]

## 2013-10-26 ENCOUNTER — Emergency Department (HOSPITAL_COMMUNITY): Payer: Medicaid Other

## 2013-10-26 ENCOUNTER — Encounter (HOSPITAL_COMMUNITY): Payer: Self-pay | Admitting: Emergency Medicine

## 2013-10-26 ENCOUNTER — Observation Stay (HOSPITAL_COMMUNITY)
Admission: EM | Admit: 2013-10-26 | Discharge: 2013-10-27 | Disposition: A | Discharge status: Home / Self Care | Payer: Medicaid Other | Attending: Internal Medicine | Admitting: Internal Medicine

## 2013-10-26 DIAGNOSIS — M5126 Other intervertebral disc displacement, lumbar region: Secondary | ICD-10-CM | POA: Insufficient documentation

## 2013-10-26 DIAGNOSIS — R531 Weakness: Secondary | ICD-10-CM

## 2013-10-26 DIAGNOSIS — R5383 Other fatigue: Secondary | ICD-10-CM

## 2013-10-26 DIAGNOSIS — R29898 Other symptoms and signs involving the musculoskeletal system: Secondary | ICD-10-CM

## 2013-10-26 DIAGNOSIS — R5381 Other malaise: Secondary | ICD-10-CM

## 2013-10-26 DIAGNOSIS — M5412 Radiculopathy, cervical region: Principal | ICD-10-CM

## 2013-10-26 DIAGNOSIS — Z720 Tobacco use: Secondary | ICD-10-CM | POA: Diagnosis present

## 2013-10-26 DIAGNOSIS — F17200 Nicotine dependence, unspecified, uncomplicated: Secondary | ICD-10-CM | POA: Insufficient documentation

## 2013-10-26 LAB — CBC WITH DIFFERENTIAL/PLATELET
BASOS ABS: 0 10*3/uL (ref 0.0–0.1)
Basophils Relative: 1 % (ref 0–1)
Eosinophils Absolute: 0.3 10*3/uL (ref 0.0–0.7)
Eosinophils Relative: 3 % (ref 0–5)
HCT: 47.9 % (ref 39.0–52.0)
Hemoglobin: 16.6 g/dL (ref 13.0–17.0)
LYMPHS ABS: 2.2 10*3/uL (ref 0.7–4.0)
LYMPHS PCT: 28 % (ref 12–46)
MCH: 32.4 pg (ref 26.0–34.0)
MCHC: 34.7 g/dL (ref 30.0–36.0)
MCV: 93.4 fL (ref 78.0–100.0)
Monocytes Absolute: 0.5 10*3/uL (ref 0.1–1.0)
Monocytes Relative: 6 % (ref 3–12)
NEUTROS ABS: 4.8 10*3/uL (ref 1.7–7.7)
NEUTROS PCT: 62 % (ref 43–77)
PLATELETS: 224 10*3/uL (ref 150–400)
RBC: 5.13 MIL/uL (ref 4.22–5.81)
RDW: 12.8 % (ref 11.5–15.5)
WBC: 7.8 10*3/uL (ref 4.0–10.5)

## 2013-10-26 LAB — BASIC METABOLIC PANEL
BUN: 10 mg/dL (ref 6–23)
CALCIUM: 9.5 mg/dL (ref 8.4–10.5)
CO2: 28 mEq/L (ref 19–32)
CREATININE: 1.14 mg/dL (ref 0.50–1.35)
Chloride: 101 mEq/L (ref 96–112)
GFR calc non Af Amer: 85 mL/min — ABNORMAL LOW (ref >90)
Glucose, Bld: 100 mg/dL — ABNORMAL HIGH (ref 70–99)
Potassium: 4 mEq/L (ref 3.7–5.3)
SODIUM: 139 meq/L (ref 137–147)

## 2013-10-26 MED ORDER — SENNOSIDES-DOCUSATE SODIUM 8.6-50 MG PO TABS: 1.0000 | ORAL_TABLET | Freq: Every evening | ORAL | Status: DC | PRN

## 2013-10-26 MED ORDER — NAPROXEN SODIUM 275 MG PO TABS: 440.0000 mg | ORAL_TABLET | Freq: Every day | ORAL | Status: DC | PRN

## 2013-10-26 MED ORDER — NAPROXEN SODIUM 275 MG PO TABS
440.0000 mg | ORAL_TABLET | Freq: Every day | ORAL | Status: DC | PRN
  Filled 2013-10-26: qty 2

## 2013-10-26 MED ORDER — NAPROXEN 500 MG PO TABS
500.0000 mg | ORAL_TABLET | Freq: Every day | ORAL | Status: DC | PRN
  Administered 2013-10-26: 500 mg via ORAL
  Filled 2013-10-26: qty 1

## 2013-10-26 MED ORDER — IBUPROFEN 800 MG PO TABS
800.0000 mg | ORAL_TABLET | Freq: Once | ORAL | Status: AC
  Administered 2013-10-26: 800 mg via ORAL
  Filled 2013-10-26: qty 1

## 2013-10-26 MED ORDER — ASPIRIN 325 MG PO TABS
325.0000 mg | ORAL_TABLET | Freq: Every day | ORAL | Status: DC
  Administered 2013-10-27: 325 mg via ORAL
  Filled 2013-10-26: qty 1

## 2013-10-26 MED ORDER — ASPIRIN 300 MG RE SUPP
300.0000 mg | Freq: Every day | RECTAL | Status: DC
  Filled 2013-10-26: qty 1

## 2013-10-26 NOTE — Evaluation (Signed)
Received call from Dr. Welton FlakesKhan (AP hospitalist). Pt with ? LUE hemiparesis since earlier today. At [facility]. EDP spoke with teleneurologist. Recommend admission for stroke work [facility] at [facility] ([facility] not available on weekends [facility] Stroke order set placed by Dr. Welton FlakesKhan. [facility]. Neuro to consult on arrival.

## 2013-10-26 NOTE — ED Provider Notes (Signed)
CSN: [id number]     Arrival date & time 10/26/13  1529 History  This chart was scribed for Johnny Hutching, MD by Dorothey Baseman, ED Scribe. This patient was seen in room APA17/APA17 and the patient's care was started at 4:16 PM.    Chief Complaint  Patient presents with  . Extremity Weakness   The history is provided by the patient. No language interpreter was used.   HPI Comments: Johnny Shepherd is a 33 y.o. male who presents to the Emergency Department complaining of numbness and weakness to the left arm (patient states that he is unable to lift or move the arm) with associated dizziness and difficulty speaking onset this morning, around 5 hours ago, upon waking from sleep. He reports an associated, diffuse headache onset last night. He reports taking Aleve at home without significant relief of the headache. Patient reports that he has been ambulatory. Patient has no other pertinent medical history. Patient is a current, every day smoker and an occasional, social drinker. He denies any other drug use. Patient reports that he does not currently have a PCP.   Past Medical History  Diagnosis Date  . Congenital absence of one kidney    History reviewed. No pertinent past surgical history. No family history on file. History  Substance Use Topics  . Smoking status: Current Every Day Smoker    Types: Cigarettes  . Smokeless tobacco: Not on file  . Alcohol Use: No    Review of Systems  A complete 10 system review of systems was obtained and all systems are negative except as noted in the HPI and PMH.    Allergies  Hydrocodone and Tramadol  Home Medications   Current Outpatient Rx  Name  Route  Sig  Dispense  Refill  . naproxen sodium (ALEVE) 220 MG tablet   Oral   Take 440 mg by mouth daily as needed. pain          Triage Vitals: BP 145/93  Pulse 86  Temp(Src) 98 F (36.7 C) (Oral)  Resp 20  Ht 6' (1.829 m)  Wt 280 lb (127.007 kg)  BMI 37.97 kg/m2  SpO2 99%  Physical Exam   Nursing note and vitals reviewed. Constitutional: He appears well-developed and well-nourished.  HENT:  Head: Normocephalic and atraumatic.  Right Ear: Hearing, tympanic membrane, external ear and ear canal normal.  Left Ear: Hearing, tympanic membrane, external ear and ear canal normal.  Mouth/Throat: Oropharynx is clear and moist.  Eyes: Conjunctivae and EOM are normal. Pupils are equal, round, and reactive to light.  Neck: Normal range of motion. Neck supple.  Cardiovascular: Normal rate, regular rhythm and normal heart sounds.   Pulmonary/Chest: Effort normal and breath sounds normal.  Abdominal: Soft. Bowel sounds are normal.  Musculoskeletal: Normal range of motion.  Patient is not moving the left arm.   Neurological: He is alert.  Sensation is intact of the left arm. Patient is alert and oriented to person and place, but was unable to recall the day correctly. Normal strength of the bilateral, lower extremities.   Skin: Skin is warm and dry.  Psychiatric: He has a normal mood and affect. His behavior is normal.    ED Course  Procedures (including critical care time)  DIAGNOSTIC STUDIES: Oxygen Saturation is 99% on room air, normal by my interpretation.    COORDINATION OF CARE: 4:21 PM- Ordered a CT of the head and discussed that results were negative. Will consult to neurology. Will order blood labs (  CBC, BMP) and an EKG. Will order ibuprofen to manage symptoms. Discussed treatment plan with patient at bedside and patient verbalized agreement.   6:12 PM- Consulted with neurology.   Results for orders placed during the hospital encounter of 10/26/13  BASIC METABOLIC PANEL      Result Value Ref Range   Sodium 139  137 - 147 mEq/L   Potassium 4.0  3.7 - 5.3 mEq/L   Chloride 101  96 - 112 mEq/L   CO2 28  19 - 32 mEq/L   Glucose, Bld 100 (*) 70 - 99 mg/dL   BUN 10  6 - 23 mg/dL   Creatinine, Ser 1.61  0.50 - 1.35 mg/dL   Calcium 9.5  8.4 - 09.6 mg/dL   GFR calc non Af  Amer 85 (*) >90 mL/min   GFR calc Af Amer >90  >90 mL/min  CBC WITH DIFFERENTIAL      Result Value Ref Range   WBC 7.8  4.0 - 10.5 K/uL   RBC 5.13  4.22 - 5.81 MIL/uL   Hemoglobin 16.6  13.0 - 17.0 g/dL   HCT 04.5  40.9 - 81.1 %   MCV 93.4  78.0 - 100.0 fL   MCH 32.4  26.0 - 34.0 pg   MCHC 34.7  30.0 - 36.0 g/dL   RDW 91.4  78.2 - 95.6 %   Platelets 224  150 - 400 K/uL   Neutrophils Relative % 62  43 - 77 %   Neutro Abs 4.8  1.7 - 7.7 K/uL   Lymphocytes Relative 28  12 - 46 %   Lymphs Abs 2.2  0.7 - 4.0 K/uL   Monocytes Relative 6  3 - 12 %   Monocytes Absolute 0.5  0.1 - 1.0 K/uL   Eosinophils Relative 3  0 - 5 %   Eosinophils Absolute 0.3  0.0 - 0.7 K/uL   Basophils Relative 1  0 - 1 %   Basophils Absolute 0.0  0.0 - 0.1 K/uL   Ct Head Wo Contrast  10/26/2013   CLINICAL DATA:  Left arm numbness for 5 hr.  EXAM: CT HEAD WITHOUT CONTRAST  TECHNIQUE: Contiguous axial images were obtained from the base of the skull through the vertex without intravenous contrast.  COMPARISON:  12/26/2004  FINDINGS: Ventricles are normal in size and configuration. There are no parenchymal masses or mass effect. There are no areas of abnormal parenchymal attenuation. There is no evidence of an infarct.  There are no extra-axial masses or abnormal fluid collections.  There is no intracranial hemorrhage.  There is mild mucosal thickening in the ethmoid, sphenoid and right frontal sinuses. Clear mastoid air cells.  IMPRESSION: 1. No intracranial abnormality. 2. Mild sinus mucosal thickening.   Electronically Signed   By: Amie Portland M.D.   On: 10/26/2013 16:04    EKG Interpretation   Date/Time:  Saturday October 26 2013 17:02:34 EDT Ventricular Rate:  88 PR Interval:  140 QRS Duration: 102 QT Interval:  378 QTC Calculation: 457 R Axis:   71 Text Interpretation:  Normal sinus rhythm Normal ECG When compared with  ECG of 21-Jun-2010 08:36, No significant change was found Confirmed by  Julissa Browning  MD, Viney Acocella  [phone]) on 10/26/2013 5:46:54 PM     CRITICAL CARE Performed by: Johnny Shepherd Total critical care time: 30 Critical care time was exclusive of separately billable procedures and treating other patients. Critical care was necessary to treat or prevent imminent or life-threatening deterioration. Critical care was  time spent personally by me on the following activities: development of treatment plan with patient and/or surrogate as well as nursing, discussions with consultants, evaluation of patient's response to treatment, examination of patient, obtaining history from patient or surrogate, ordering and performing treatments and interventions, ordering and review of laboratory studies, ordering and review of radiographic studies, pulse oximetry and re-evaluation of patient's condition. MDM   Final diagnoses:  Left arm weakness  Uncertain etiology of left arm weakness.  Tele neurology consult obtained.   Neurologist rendered opinion that patient needs MRI of brain with contrast, MRA of head and neck, CT scan of abdomen/ pelvis.  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    I personally performed the services described in this documentation, which was scribed in my presence. The recorded information has been reviewed and is accurate.      Johnny HutchingBrian Alayna Mabe, MD 10/26/13 [phone]

## 2013-10-26 NOTE — Progress Notes (Signed)
Pt arrived to 4N09 and was able to walk into the room off of the stretcher. The weakness in his left arm has resolved. He is AAOx4. Telemetry was placed, vital signs were taken and the call bell is by his side. His wife is at the bedside. He was oriented to the room and lying comfortably in bed. Will continue to monitor. Tavon Magnussen, [place] ScrapeSarah E

## 2013-10-26 NOTE — ED Notes (Signed)
teleneurology completed.

## 2013-10-26 NOTE — ED Notes (Signed)
Pt states he had a headache last night. States he took two [place] and went to bed around 0400. States he woke up at 1100 and his left arm was numb and he could not move it.

## 2013-10-26 NOTE — ED Notes (Signed)
Pt drove himself to the ED. States he wife and kids were at a birthday party

## 2013-10-26 NOTE — H&P (Signed)
[facility] History and Physical  Johnny Shepherd:[id number] DOB: 03/26/1981 DOA: 10/26/2013  Referring physician: [name], MD PCP: No PCP Per Patient   Chief Complaint: Weakness in Left Arm  HPI: Johnny Shepherd is a 33 y.o. male presents to the ED with sudden onset of weakness in the left arm. He states that he found the arm to be weak this morning. He states associated with this was numbness. He has not had any prior findings like this. He states that he does have some issues with his shoulder in the past. He has also got a history of lumbar spine problems and had an MRI in 2007 showing a narrow canal with mild stenosis at L4-5 and moderately large central and left sided disc protrusion L5-S1 compressing the left S1 root. Currently he states that he is having a little pain in the lower back also. He denies any LOC and has a headache. No prior history of migraines. Denies having chest pain presently. Denies any seizure activity.    Review of Systems:  Constitutional:  No weight loss, night sweats, Fevers, chills, fatigue.  HEENT:  No headaches, Difficulty swallowing,Sore throat,  No sneezing, itching, ear ache, nasal congestion, post nasal drip,  Cardio-vascular:  No chest pain, Orthopnea, PND, swelling in lower extremities, anasarca, dizziness, palpitations  GI:  No heartburn, indigestion, abdominal pain, nausea, vomiting, diarrhea, change in bowel habits, loss of appetite  Resp:  No shortness of breath with exertion or at rest. No excess mucus, no productive cough, No non-productive cough, No coughing up of blood.No change in color of mucus.No wheezing.No chest wall deformity  Skin:  no rash or lesions.  GU:  no dysuria, change in color of urine, no urgency or frequency. No flank pain.  Musculoskeletal:  ++decreased range of motion on left arm. ++back pain.  Psych:  No change in mood or affect. No depression or anxiety. No memory loss.   Past Medical History    Diagnosis Date  . Congenital absence of one kidney    History reviewed. No pertinent past surgical history. Social History:  reports that he has been smoking Cigarettes.  He has been smoking about 0.00 packs per day. He does not have any smokeless tobacco history on file. He reports that he does not drink alcohol or use illicit drugs.  Allergies  Allergen Reactions  . Hydrocodone Itching  . Tramadol Nausea And Vomiting    No family history on file.   Prior to Admission medications   Medication Sig Start Date End Date Taking? Authorizing Provider  naproxen sodium (ALEVE) 220 MG tablet Take 440 mg by mouth daily as needed. pain   Yes Historical Provider, MD   Physical Exam: Filed Vitals:   10/26/13 1823  BP: 129/62  Pulse: 79  Temp:   Resp: 18    BP 129/62  Pulse 79  Temp(Src) 98 F (36.7 C) (Oral)  Resp 18  Ht 6' (1.829 m)  Wt 127.007 kg (280 lb)  BMI 37.97 kg/m2  SpO2 97%  General:  Appears calm and comfortable Eyes: PERRL, normal lids, irises & conjunctiva ENT: grossly normal hearing, lips & tongue Neck: no LAD, masses or thyromegaly Cardiovascular: RRR, no m/r/g. No LE edema. Telemetry: SR, no arrhythmias  Respiratory: CTA bilaterally, no w/r/r. Normal respiratory effort. Abdomen: soft, ntnd Skin: no rash or induration seen on limited exam Musculoskeletal: normal ROM on right side. Left arm decreased ability to lift. Tone and resistance is felt however on passive movements.  He is not able to voluntarily lift the arm Psychiatric: grossly normal mood and affect, speech fluent and appropriate Neurologic: LUE weakness states he is unable to move          Labs on Admission:  Basic Metabolic Panel:  Recent Labs Lab 10/26/13 1637  NA 139  K 4.0  CL 101  CO2 28  GLUCOSE 100*  BUN 10  CREATININE 1.14  CALCIUM 9.5   Liver Function Tests: No results found for this basename: AST, ALT, ALKPHOS, BILITOT, PROT, ALBUMIN,  in the last 168 hours No results found  for this basename: LIPASE, AMYLASE,  in the last 168 hours No results found for this basename: AMMONIA,  in the last 168 hours CBC:  Recent Labs Lab 10/26/13 1637  WBC 7.8  NEUTROABS 4.8  HGB 16.6  HCT 47.9  MCV 93.4  PLT 224   Cardiac Enzymes: No results found for this basename: CKTOTAL, CKMB, CKMBINDEX, TROPONINI,  in the last 168 hours  BNP (last 3 results) No results found for this basename: PROBNP,  in the last 8760 hours CBG: No results found for this basename: GLUCAP,  in the last 168 hours  Radiological Exams on Admission: Ct Head Wo Contrast  10/26/2013   CLINICAL DATA:  Left arm numbness for 5 hr.  EXAM: CT HEAD WITHOUT CONTRAST  TECHNIQUE: Contiguous axial images were obtained from the base of the skull through the vertex without intravenous contrast.  COMPARISON:  12/26/2004  FINDINGS: Ventricles are normal in size and configuration. There are no parenchymal masses or mass effect. There are no areas of abnormal parenchymal attenuation. There is no evidence of an infarct.  There are no extra-axial masses or abnormal fluid collections.  There is no intracranial hemorrhage.  There is mild mucosal thickening in the ethmoid, sphenoid and right frontal sinuses. Clear mastoid air cells.  IMPRESSION: 1. No intracranial abnormality. 2. Mild sinus mucosal thickening.   Electronically Signed   By: Amie Portlandavid  Ormond M.D.   On: 10/26/2013 16:04   MRI LUMBAR SPINE 08/12/2005  MR Lumbar Spine Wo Contrast Status: Final result         PACS Images    Show images for MR Lumbar Spine Wo Contrast         Study Result    Clinical data: Low back pain. Bilateral leg pain.  MRI LUMBAR SPINE WITHOUT CONTRAST:  Technique: Multiplanar and multiecho pulse sequences of the lumbar spine, to include the lower thoracic and upper sacral regions, were obtained according to standard protocol without IV contrast.  Comparison: No comparison.  Findings: Normal alignment. No fracture. The conus  medullaris is normal. No bony lesion is identified.  L1-2: Negative.  L2-3: Negative.  L3-4: There is mild congenital lumbar stenosis with mild facet arthropathy.  L4-5: Disc bulging and mild facet disease are present. There is mild to moderate spinal stenosis due to congenital and acquired stenosis.  L5-S1: There is a moderately large central disc protrusion eccentric to the left. This is compressed in the left S1 nerve root. It is also touching the right S1 root. There is mild facet arthropathy.  IMPRESSION:  Congenitally a small lumbar canal. There is mild stenosis at L4-5.  Moderately large central and left sided disc protrusion L5-S1 compressing the left S1 root.  Provider: Elizebeth BrookingShane Ellis     Assessment/Plan Active Problems:   Weakness of left arm   Weakness   Tobacco abuse   1. Weakness of Left Arm -unclear etiology. -Consultation by  ED physician to neurology they recommend a MRI and MRA for the weakness -will transfer to [facility] accepting Physician Andrena Mews, MD  2. Tobacco Use -counseled on smoking cessation  3. History of lumbar pain -has minor back pain -results of MRI noted  Code Status: Full Code(must indicate code status--if unknown or must be presumed, indicate so) Family Communication: Wife present in room (indicate person spoken with, if applicable, with phone number if by telephone) Disposition Plan: Transfer to [facility] (indicate anticipated LOS)  Time spent:  [facility] Pager [phone]

## 2013-10-27 ENCOUNTER — Observation Stay (HOSPITAL_COMMUNITY): Payer: Medicaid Other

## 2013-10-27 DIAGNOSIS — R29898 Other symptoms and signs involving the musculoskeletal system: Secondary | ICD-10-CM

## 2013-10-27 DIAGNOSIS — F17200 Nicotine dependence, unspecified, uncomplicated: Secondary | ICD-10-CM

## 2013-10-27 DIAGNOSIS — R209 Unspecified disturbances of skin sensation: Secondary | ICD-10-CM

## 2013-10-27 DIAGNOSIS — M5412 Radiculopathy, cervical region: Secondary | ICD-10-CM | POA: Diagnosis present

## 2013-10-27 LAB — GLUCOSE, CAPILLARY
GLUCOSE-CAPILLARY: 98 mg/dL (ref 70–99)
Glucose-Capillary: 126 mg/dL — ABNORMAL HIGH (ref 70–99)

## 2013-10-27 LAB — LIPID PANEL
CHOL/HDL RATIO: 5 ratio
Cholesterol: 166 mg/dL (ref 0–200)
HDL: 33 mg/dL — AB (ref >39)
LDL CALC: 73 mg/dL (ref 0–99)
Triglycerides: 302 mg/dL — ABNORMAL HIGH (ref <150)
VLDL: 60 mg/dL — AB (ref 0–40)

## 2013-10-27 LAB — HEMOGLOBIN A1C
HEMOGLOBIN A1C: 5.4 % (ref <5.7)
Mean Plasma Glucose: 108 mg/dL (ref <117)

## 2013-10-27 NOTE — Progress Notes (Signed)
VASCULAR LAB PRELIMINARY  PRELIMINARY  PRELIMINARY  PRELIMINARY  Carotid Dopplers completed.    Preliminary report:  1-39% ICA stenosis.  Vertebral artery flow is antegrade.  Amarien Carne, RVT 10/27/2013, 10:36 AM

## 2013-10-27 NOTE — Progress Notes (Signed)
OT Cancellation Note  Patient Details Name: Johnny Shepherd MRN: [id number] DOB: May 01, 1981   Cancelled Treatment:    Reason Eval/Treat Not Completed: OT screened, no needs identified, will sign off.  PT notified OT that pt has no OT needs.   Johnny Shepherd, Johnny Shepherd [phone] 10/27/2013, 11:49 AM

## 2013-10-27 NOTE — Progress Notes (Signed)
Pt inquired while in MRI if we were going to do his Liver MRI as well. I told pt I would check into this as well. I did see where a MRI was suggested on CT. I am posting this to pass along pt's inquiry and will call pt's RN as well. Johnny Shepherd , RT,R-MR

## 2013-10-27 NOTE — Evaluation (Signed)
Physical Therapy Evaluation Patient Details Name: Johnny SimpsonDewey R Shepherd MRN: [id number] DOB: 1981/07/24 Today's Date: 10/27/2013   History of Present Illness  Patient is a 33 yo male admitted with LUE numbness/weakness - resolved per patient.  Clinical Impression  Patient is independent in all mobility and gait.  Able to negotiate stairs independently.  Provided patient and wife with education on warning signs and risk factors for stroke.  Voiced understanding.  No further acute PT needs - PT will sign off.    Follow Up Recommendations  No PT follow-up    Equipment Recommendations    None recommended by PT   Recommendations for Other Services       Precautions / Restrictions Precautions Precautions: None Restrictions Weight Bearing Restrictions: No      Mobility  Bed Mobility Overal bed mobility: Independent                Transfers Overall transfer level: Independent Equipment used: None             General transfer comment: Good static standing balance  Ambulation/Gait Ambulation/Gait assistance: Independent Ambulation Distance (Feet): 200 Feet Assistive device: None Gait Pattern/deviations: WFL(Within Functional Limits) Gait velocity: WFL Gait velocity interpretation: at or above normal speed for age/gender General Gait Details: Patient with good gait pattern, balance, and speed.  Stairs Stairs: Yes Stairs assistance: Independent Stair Management: No rails;Alternating pattern;Forwards Number of Stairs: 4 General stair comments: Patient independent with negotiating stairs  Wheelchair Mobility    Modified Rankin (Stroke Patients Only) Modified Rankin (Stroke Patients Only) Pre-Morbid Rankin Score: No symptoms Modified Rankin: No symptoms     Balance Overall balance assessment: Independent  DGI balance assessment:  Scored 24/24                                 Pertinent Vitals/Pain     Home Living Family/patient expects to be  discharged to:: Private residence Living Arrangements: Spouse/significant other Available Help at Discharge: Family;Available 24 hours/day Type of Home: House Home Access: Stairs to enter Entrance Stairs-Rails: Doctor, general practiceight;Left Entrance Stairs-Number of Steps: 5 Home Layout: One level Home Equipment: None      Prior Function Level of Independence: Independent               Hand Dominance   Dominant Hand: Right    Extremity/Trunk Assessment   Upper Extremity Assessment: Overall WFL for tasks assessed           Lower Extremity Assessment: Overall WFL for tasks assessed (Patient reports Lt knee "gives him trouble")      Cervical / Trunk Assessment: Normal  Communication   Communication: No difficulties  Cognition Arousal/Alertness: Awake/alert Behavior During Therapy: WFL for tasks assessed/performed Overall Cognitive Status: Within Functional Limits for tasks assessed                      General Comments      Exercises        Assessment/Plan    PT Assessment Patent does not need any further PT services  PT Diagnosis     PT Problem List    PT Treatment Interventions     PT Goals (Current goals can be found in the Care Plan section)      Frequency     Barriers to discharge        End of Session Equipment Utilized During Treatment: Gait belt Activity Tolerance: Patient tolerated treatment  well Patient left: in bed;with call bell/phone within reach;with family/visitor present    Functional Assessment Tool Used: Clinical judgement; DGI balance assessment Functional Limitation: Mobility: Walking and moving around Mobility: Walking and Moving Around Current Status [phone]): 0 percent impaired, limited or restricted Mobility: Walking and Moving Around Goal Status [phone]): 0 percent impaired, limited or restricted Mobility: Walking and Moving Around Discharge Status [phone]): 0 percent impaired, limited or restricted    Time: 9147-8295 PT Time  Calculation (min): 19 min   Charges:   PT Evaluation $Initial PT Evaluation Tier I: 1 Procedure PT Treatments $Gait Training: 8-22 mins   PT G Codes:   Functional Assessment Tool Used: Clinical judgement; DGI balance assessment Functional Limitation: Mobility: Walking and moving around    Vena Austria 10/27/2013, 9:55 AM  Durenda Hurt. Renaldo Fiddler, [facility] Pager [phone]

## 2013-10-27 NOTE — Discharge Summary (Signed)
Physician Discharge Summary  Johnny Shepherd ZOX:[id number] DOB: [birth year] DOA: 10/26/2013  PCP: No PCP Per Patient  Admit date: 10/26/2013 Discharge date: 10/27/2013  Time spent: 35 minutes  Recommendations for Outpatient Follow-up:  1. Please followup on 13 mm hypodense lesion within the posterior right liver, seen on CT scan of abdomen and pelvis on 03/02/2013. Radiology reporting this is nonspecific. Radiology recommended ultrasound or MRI. On this hospitalization I recommended an MRI of liver for further workup prior to discharge, however, patient expressed his desire to be discharged this morning and follow up with a primary care provider to schedule outpatient imaging.  2. Prior to discharge case manager consulted to set patient up with PCP for hospital followup   Discharge Diagnoses:  Principal Problem:   Cervical radiculopathy Active Problems:   Weakness of left arm   Tobacco abuse   Discharge Condition: Stable  Diet recommendation: Heart Healthy  Filed Weights   10/26/13 1548  Weight: 127.007 kg (280 lb)    History of present illness:  Johnny Shepherd is a 33 y.o. male presents to the ED with sudden onset of weakness in the left arm. He states that he found the arm to be weak this morning. He states associated with this was numbness. He has not had any prior findings like this. He states that he does have some issues with his shoulder in the past. He has also got a history of lumbar spine problems and had an MRI in 2007 showing a narrow canal with mild stenosis at L4-5 and moderately large central and left sided disc protrusion L5-S1 compressing the left S1 root. Currently he states that he is having a little pain in the lower back also. He denies any LOC and has a headache. No prior history of migraines. Denies having chest pain presently. Denies any seizure activity.  Hospital Course:  Patient is a 33 year old gentleman with past medical history of DJD of the back, presented  with complaints of left arm pain and associated numbness. Presented as a transfer from [facility] for further workup of his weakness and numbness. He had an MRI of brain performed on 10/27/2013 which did not show CVA, radiology reporting normal noncontrast MRI appearance of the brain. Intracranial MRA was negative as well. Patient reporting intermittent episodes of numbness tingling as well as weakness to his left upper extremity since admission. He did report some neck pain, I suspect symptoms may be related to cervical radiculopathy. Patient having a CT scan of abdomen and pelvis on 03/02/2013 which showed a 13 mm hypodense lesion felt to be nonspecific. Prior to discharge I recommend an MRI of liver further workup however he expresses desire to continue workup in the outpatient setting asking to be discharged this morning. Prior to discharge case manager consulted to set patient up with PCP for hospital followup.   Discharge Exam: Filed Vitals:   10/27/13 0900  BP: 137/75  Pulse: 64  Temp: 97.9 F (36.6 C)  Resp: 18    General: Patient is in no acute distress, awake alert oriented, reports resolution to left upper extremity numbness and weakness Cardiovascular: Regular rate rhythm normal S1-S2 Respiratory: Clear to auscultation bilaterally no wheezing rhonchi or rales Abdomen: Soft nontender nondistended Neurological. Patient having nonfocal neurologic examination, cranial nerves II through XII are grossly intact. He has 5 of 5 muscle strength to bilateral upper and lower extremities without alteration to sensation.  Discharge Instructions  Discharge Orders   Future Orders Complete By Expires  Call MD for:  difficulty breathing, headache or visual disturbances  As directed    Call MD for:  extreme fatigue  As directed    Call MD for:  persistant dizziness or light-headedness  As directed    Call MD for:  persistant nausea and vomiting  As directed    Call MD for:  severe  uncontrolled pain  As directed    Call MD for:  temperature >100.4  As directed    Diet - low sodium heart healthy  As directed    Increase activity slowly  As directed        Medication List         ALEVE 220 MG tablet  Generic drug:  naproxen sodium  Take 440 mg by mouth daily as needed. pain       Allergies  Allergen Reactions  . Hydrocodone Itching  . Tramadol Nausea And Vomiting       Follow-up Information   Follow up with No PCP Per Patient.   Specialty:  General Practice       The results of significant diagnostics from this hospitalization (including imaging, microbiology, ancillary and laboratory) are listed below for reference.    Significant Diagnostic Studies: Dg Chest 2 View  10/27/2013   CLINICAL DATA:  Stroke.  EXAM: CHEST - 2 VIEW  COMPARISON:  CT ABD - PELV W/ CM dated 03/02/2013; DG CHEST 1V PORT dated 06/21/2010  FINDINGS: The heart size and mediastinal contours are within normal limits. There is no evidence of pulmonary edema, consolidation, pneumothorax, nodule or pleural fluid. The visualized skeletal structures are unremarkable.  IMPRESSION: No active disease.   Electronically Signed   By: Irish Lack M.D.   On: 10/27/2013 08:42   Ct Head Wo Contrast  10/26/2013   CLINICAL DATA:  Left arm numbness for 5 hr.  EXAM: CT HEAD WITHOUT CONTRAST  TECHNIQUE: Contiguous axial images were obtained from the base of the skull through the vertex without intravenous contrast.  COMPARISON:  12/26/2004  FINDINGS: Ventricles are normal in size and configuration. There are no parenchymal masses or mass effect. There are no areas of abnormal parenchymal attenuation. There is no evidence of an infarct.  There are no extra-axial masses or abnormal fluid collections.  There is no intracranial hemorrhage.  There is mild mucosal thickening in the ethmoid, sphenoid and right frontal sinuses. Clear mastoid air cells.  IMPRESSION: 1. No intracranial abnormality. 2. Mild sinus  mucosal thickening.   Electronically Signed   By: Amie Portland M.D.   On: 10/26/2013 16:04   Mr Brain Wo Contrast  10/27/2013   CLINICAL DATA:  33 year old male with sudden onset left upper extremity weakness and numbness. Initial encounter.  EXAM: MRI HEAD WITHOUT CONTRAST  MRA HEAD WITHOUT CONTRAST  TECHNIQUE: Multiplanar, multiecho pulse sequences of the brain and surrounding structures were obtained without intravenous contrast. Angiographic images of the head were obtained using MRA technique without contrast.  COMPARISON:  Head CTs 10/26/2013 and earlier.  FINDINGS: MRI HEAD FINDINGS  Study is intermittently degraded by motion artifact despite repeated imaging attempts.  Cerebral volume is normal. No restricted diffusion to suggest acute infarction. No midline shift, mass effect, evidence of mass lesion, ventriculomegaly, extra-axial collection or acute intracranial hemorrhage. Cervicomedullary junction and pituitary are within normal limits. Negative visualized cervical spine. Major intracranial vascular flow voids are preserved, dominant distal left vertebral artery.  Wallace Cullens and white matter signal is within normal limits throughout the brain.  Mild paranasal sinus  mucosal thickening. Visualized orbit soft tissues are within normal limits. Visible internal auditory structures appear normal. Trace inferior right mastoid fluid.  Adenoid hypertrophy. Cervical lymph nodes are within normal limits. Visualized bone marrow signal is within normal limits. Visualized scalp soft tissues are within normal limits.  MRA HEAD FINDINGS  Antegrade flow in the posterior circulation with dominant distal left vertebral artery. The right functionally terminates in PICA. Normal left PICA origin. Normal basilar artery. Normal SCA and PCA origins. Diminutive posterior communicating arteries. Normal bilateral PCA branches.  Antegrade flow in both ICA siphons. No ICA stenosis. Normal carotid termini, MCA and ACA origins.  Ophthalmic and posterior communicating artery origins are normal. Normal anterior communicating artery. Motion artifact affecting detail of some of the bilateral ACA and MCA branches. Normal MCA M1 segments. Visualized bilateral MCA and ACA branches are within normal limits.  IMPRESSION: 1.  Normal noncontrast MRI appearance of the brain. 2.  Negative intracranial MRA. 3. Mild paranasal sinus inflammatory changes.   Electronically Signed   By: Augusto GambleLee  Hall M.D.   On: 10/27/2013 09:51   Mr Maxine GlennMra Head/brain Wo Cm  10/27/2013   CLINICAL DATA:  33 year old male with sudden onset left upper extremity weakness and numbness. Initial encounter.  EXAM: MRI HEAD WITHOUT CONTRAST  MRA HEAD WITHOUT CONTRAST  TECHNIQUE: Multiplanar, multiecho pulse sequences of the brain and surrounding structures were obtained without intravenous contrast. Angiographic images of the head were obtained using MRA technique without contrast.  COMPARISON:  Head CTs 10/26/2013 and earlier.  FINDINGS: MRI HEAD FINDINGS  Study is intermittently degraded by motion artifact despite repeated imaging attempts.  Cerebral volume is normal. No restricted diffusion to suggest acute infarction. No midline shift, mass effect, evidence of mass lesion, ventriculomegaly, extra-axial collection or acute intracranial hemorrhage. Cervicomedullary junction and pituitary are within normal limits. Negative visualized cervical spine. Major intracranial vascular flow voids are preserved, dominant distal left vertebral artery.  Wallace CullensGray and white matter signal is within normal limits throughout the brain.  Mild paranasal sinus mucosal thickening. Visualized orbit soft tissues are within normal limits. Visible internal auditory structures appear normal. Trace inferior right mastoid fluid.  Adenoid hypertrophy. Cervical lymph nodes are within normal limits. Visualized bone marrow signal is within normal limits. Visualized scalp soft tissues are within normal limits.  MRA HEAD  FINDINGS  Antegrade flow in the posterior circulation with dominant distal left vertebral artery. The right functionally terminates in PICA. Normal left PICA origin. Normal basilar artery. Normal SCA and PCA origins. Diminutive posterior communicating arteries. Normal bilateral PCA branches.  Antegrade flow in both ICA siphons. No ICA stenosis. Normal carotid termini, MCA and ACA origins. Ophthalmic and posterior communicating artery origins are normal. Normal anterior communicating artery. Motion artifact affecting detail of some of the bilateral ACA and MCA branches. Normal MCA M1 segments. Visualized bilateral MCA and ACA branches are within normal limits.  IMPRESSION: 1.  Normal noncontrast MRI appearance of the brain. 2.  Negative intracranial MRA. 3. Mild paranasal sinus inflammatory changes.   Electronically Signed   By: Augusto GambleLee  Hall M.D.   On: 10/27/2013 09:51    Microbiology: No results found for this or any previous visit (from the past 240 hour(s)).   Labs: Basic Metabolic Panel:  Recent Labs Lab 10/26/13 1637  NA 139  K 4.0  CL 101  CO2 28  GLUCOSE 100*  BUN 10  CREATININE 1.14  CALCIUM 9.5   Liver Function Tests: No results found for this basename: AST, ALT, ALKPHOS, BILITOT,  PROT, ALBUMIN,  in the last 168 hours No results found for this basename: LIPASE, AMYLASE,  in the last 168 hours No results found for this basename: AMMONIA,  in the last 168 hours CBC:  Recent Labs Lab 10/26/13 1637  WBC 7.8  NEUTROABS 4.8  HGB 16.6  HCT 47.9  MCV 93.4  PLT 224   Cardiac Enzymes: No results found for this basename: CKTOTAL, CKMB, CKMBINDEX, TROPONINI,  in the last 168 hours BNP: BNP (last 3 results) No results found for this basename: PROBNP,  in the last 8760 hours CBG:  Recent Labs Lab 10/27/13 0703  GLUCAP 98       Signed:  Isaul Landi  [facility] 10/27/2013, 11:16 AM

## 2013-10-27 NOTE — Progress Notes (Signed)
Pt discharge education and instructions completed with pt with spouse at side in room. Both voices understanding and denies any questions. Pt provided education printout on stroke and cholesterol. Pt IV and telemetry removed. Pt ambulated off unit with belongings and spouse at side. Johnny BucyP. Amo Christiano Blandon RN

## 2013-10-30 NOTE — Progress Notes (Signed)
To whom it may concern:    Daneen SchickDewey Grecco may return to full time work.  No heavy lifting above 20 pounds until follow up with primary care provider of choice.   Sincerely,     Crista Curborinna Taisa Deloria, M.D. [facility]

## 2013-11-07 ENCOUNTER — Other Ambulatory Visit (HOSPITAL_COMMUNITY): Payer: Self-pay | Admitting: Nurse Practitioner

## 2013-11-07 DIAGNOSIS — K769 Liver disease, unspecified: Secondary | ICD-10-CM

## 2013-11-13 ENCOUNTER — Ambulatory Visit (HOSPITAL_COMMUNITY)
Admission: RE | Admit: 2013-11-13 | Discharge: 2013-11-13 | Disposition: A | Discharge status: Home / Self Care | Payer: Medicaid Other | Source: Ambulatory Visit | Attending: Nurse Practitioner | Admitting: Nurse Practitioner

## 2013-11-13 DIAGNOSIS — D7389 Other diseases of spleen: Secondary | ICD-10-CM | POA: Insufficient documentation

## 2013-11-13 DIAGNOSIS — Q602 Renal agenesis, unspecified: Secondary | ICD-10-CM | POA: Diagnosis not present

## 2013-11-13 DIAGNOSIS — K769 Liver disease, unspecified: Secondary | ICD-10-CM

## 2013-11-13 DIAGNOSIS — K7689 Other specified diseases of liver: Secondary | ICD-10-CM | POA: Diagnosis not present

## 2013-11-13 DIAGNOSIS — Q605 Renal hypoplasia, unspecified: Secondary | ICD-10-CM | POA: Diagnosis not present

## 2013-11-13 MED ORDER — SODIUM CHLORIDE 0.9 % IV SOLN
INTRAVENOUS | Status: AC
  Filled 2013-11-13: qty 150

## 2013-11-13 MED ORDER — GADOBENATE DIMEGLUMINE 529 MG/ML IV SOLN
20.0000 mL | Freq: Once | INTRAVENOUS | Status: AC | PRN
  Administered 2013-11-13: 20 mL via INTRAVENOUS

## 2014-03-06 ENCOUNTER — Encounter (HOSPITAL_COMMUNITY): Payer: Self-pay | Admitting: Emergency Medicine

## 2014-03-06 ENCOUNTER — Emergency Department (HOSPITAL_COMMUNITY)
Admission: EM | Admit: 2014-03-06 | Discharge: 2014-03-06 | Disposition: A | Discharge status: Home / Self Care | Payer: Medicaid Other | Attending: Emergency Medicine | Admitting: Emergency Medicine

## 2014-03-06 DIAGNOSIS — Z791 Long term (current) use of non-steroidal anti-inflammatories (NSAID): Secondary | ICD-10-CM | POA: Insufficient documentation

## 2014-03-06 DIAGNOSIS — Q602 Renal agenesis, unspecified: Secondary | ICD-10-CM | POA: Insufficient documentation

## 2014-03-06 DIAGNOSIS — Q605 Renal hypoplasia, unspecified: Secondary | ICD-10-CM | POA: Diagnosis not present

## 2014-03-06 DIAGNOSIS — F17200 Nicotine dependence, unspecified, uncomplicated: Secondary | ICD-10-CM | POA: Diagnosis not present

## 2014-03-06 DIAGNOSIS — R109 Unspecified abdominal pain: Secondary | ICD-10-CM | POA: Diagnosis present

## 2014-03-06 DIAGNOSIS — N453 Epididymo-orchitis: Secondary | ICD-10-CM | POA: Diagnosis not present

## 2014-03-06 DIAGNOSIS — N451 Epididymitis: Secondary | ICD-10-CM

## 2014-03-06 MED ORDER — OXYCODONE-ACETAMINOPHEN 5-325 MG PO TABS: 1.0000 | ORAL_TABLET | ORAL | Status: DC | PRN

## 2014-03-06 MED ORDER — CIPROFLOXACIN HCL 500 MG PO TABS: 500.0000 mg | ORAL_TABLET | Freq: Two times a day (BID) | ORAL | Status: DC

## 2014-03-06 NOTE — ED Notes (Signed)
Pt c/o right sided groin pain x 2 days. States "it may be a bit swollen, like a knot".

## 2014-03-06 NOTE — ED Provider Notes (Signed)
CSN: [id number]     Arrival date & time 03/06/14  2041 History   First MD Initiated Contact with Patient 03/06/14 2102     Chief Complaint  Patient presents with  . Groin Pain     (Consider location/radiation/quality/duration/timing/severity/associated sxs/prior Treatment) HPI Comments: Patient is a 33 year old male who presents with complaints of right testicle pain for the past 2 days. He denies any injury or trauma. It started gradually and has progressively become more painful. He denies any vomiting. He denies any trouble urinating. He denies any fevers or chills.  Patient is a 33 y.o. male presenting with groin pain. The history is provided by the patient.  Groin Pain This is a new problem. The current episode started 2 days ago. The problem occurs constantly. The problem has been rapidly worsening. Pertinent negatives include no abdominal pain. Nothing aggravates the symptoms. Nothing relieves the symptoms. He has tried nothing for the symptoms. The treatment provided no relief.    Past Medical History  Diagnosis Date  . Congenital absence of one kidney    History reviewed. No pertinent past surgical history. No family history on file. History  Substance Use Topics  . Smoking status: Current Every Day Smoker    Types: Cigarettes  . Smokeless tobacco: Not on file  . Alcohol Use: No    Review of Systems  Gastrointestinal: Negative for abdominal pain.  All other systems reviewed and are negative.     Allergies  Hydrocodone and Tramadol  Home Medications   Prior to Admission medications   Medication Sig Start Date End Date Taking? Authorizing Provider  naproxen sodium (ALEVE) 220 MG tablet Take 440 mg by mouth daily as needed. pain    Historical Provider, MD   BP 137/76  Pulse 94  Temp(Src) 98.3 F (36.8 C) (Oral)  Resp 20  Ht 6' (1.829 m)  Wt 270 lb (122.471 kg)  BMI 36.61 kg/m2  SpO2 98% Physical Exam  Nursing note and vitals reviewed. Constitutional: He  is oriented to person, place, and time. He appears well-developed and well-nourished. No distress.  HENT:  Head: Normocephalic and atraumatic.  Mouth/Throat: Oropharynx is clear and moist.  Neck: Normal range of motion. Neck supple.  Genitourinary:  The external genitalia appears grossly normal. The right testicle is noted to have tenderness to palpation over the posterior aspect at the epididymis. The testicle was freely mobile.  Neurological: He is alert and oriented to person, place, and time.  Skin: Skin is warm. He is not diaphoretic.    ED Course  Procedures (including critical care time) Labs Review Labs Reviewed - No data to display  Imaging Review No results found.   EKG Interpretation None      MDM   Final diagnoses:  None    Appears to be epididymitis. He is tender in this region in the testicle itself is freely mobile. I highly doubt a portion of as this is been ongoing for 2 days and the exam is not consistent with this. He will be treated with antibiotics, pain medication and will return tomorrow for an ultrasound of the testicle to rule out other pathology    Geoffery Lyonsouglas Sebastin Perlmutter, MD 03/06/14 2118

## 2014-03-06 NOTE — Discharge Instructions (Signed)
Cipro as prescribed.  Percocet as prescribed as needed for pain.  Returned to radiology tomorrow for an ultrasound at the given time.   Epididymitis Epididymitis is a swelling (inflammation) of the epididymis. The epididymis is a cord-like structure along the back part of the testicle. Epididymitis is usually, but not always, caused by infection. This is usually a sudden problem beginning with chills, fever and pain behind the scrotum and in the testicle. There may be swelling and redness of the testicle. DIAGNOSIS  Physical examination will reveal a tender, swollen epididymis. Sometimes, cultures are obtained from the urine or from prostate secretions to help find out if there is an infection or if the cause is a different problem. Sometimes, blood work is performed to see if your white blood cell count is elevated and if a germ (bacterial) or viral infection is present. Using this knowledge, an appropriate medicine which kills germs (antibiotic) can be chosen by your caregiver. A viral infection causing epididymitis will most often go away (resolve) without treatment. HOME CARE INSTRUCTIONS   Hot sitz baths for 20 minutes, 4 times per day, may help relieve pain.  Only take over-the-counter or prescription medicines for pain, discomfort or fever as directed by your caregiver.  Take all medicines, including antibiotics, as directed. Take the antibiotics for the full prescribed length of time even if you are feeling better.  It is very important to keep all follow-up appointments. SEEK IMMEDIATE MEDICAL CARE IF:   You have a fever.  You have pain not relieved with medicines.  You have any worsening of your problems.  Your pain seems to come and go.  You develop pain, redness, and swelling in the scrotum and surrounding areas. MAKE SURE YOU:   Understand these instructions.  Will watch your condition.  Will get help right away if you are not doing well or get worse. Document  Released: 07/15/2000 Document Revised: 10/10/2011 Document Reviewed: 06/04/2009 [facility] Patient Information 2015 [employer] [place]. This information is not intended to replace advice given to you by your health care provider. Make sure you discuss any questions you have with your health care provider.

## 2014-03-06 NOTE — ED Notes (Signed)
Pt vebalized understanding of no driving within 4 hours of taking Percocet due to med causes drowsiness, also made med can cause constipation as well

## 2014-03-07 ENCOUNTER — Other Ambulatory Visit (HOSPITAL_COMMUNITY): Payer: Self-pay | Admitting: Emergency Medicine

## 2014-03-07 ENCOUNTER — Ambulatory Visit (HOSPITAL_COMMUNITY)
Admit: 2014-03-07 | Discharge: 2014-03-07 | Disposition: A | Discharge status: Home / Self Care | Payer: Medicaid Other | Attending: Emergency Medicine | Admitting: Emergency Medicine

## 2014-03-07 DIAGNOSIS — N509 Disorder of male genital organs, unspecified: Secondary | ICD-10-CM | POA: Insufficient documentation

## 2014-03-07 DIAGNOSIS — N50811 Right testicular pain: Secondary | ICD-10-CM

## 2014-03-07 DIAGNOSIS — N433 Hydrocele, unspecified: Secondary | ICD-10-CM | POA: Diagnosis not present

## 2014-11-10 ENCOUNTER — Encounter (HOSPITAL_COMMUNITY): Payer: Self-pay | Admitting: Cardiology

## 2014-11-10 ENCOUNTER — Emergency Department (HOSPITAL_COMMUNITY)
Admission: EM | Admit: 2014-11-10 | Discharge: 2014-11-10 | Disposition: A | Discharge status: Home / Self Care | Payer: Medicaid Other | Attending: Emergency Medicine | Admitting: Emergency Medicine

## 2014-11-10 DIAGNOSIS — N508 Other specified disorders of male genital organs: Secondary | ICD-10-CM | POA: Diagnosis not present

## 2014-11-10 DIAGNOSIS — Z792 Long term (current) use of antibiotics: Secondary | ICD-10-CM | POA: Insufficient documentation

## 2014-11-10 DIAGNOSIS — Z87891 Personal history of nicotine dependence: Secondary | ICD-10-CM | POA: Insufficient documentation

## 2014-11-10 DIAGNOSIS — M549 Dorsalgia, unspecified: Secondary | ICD-10-CM | POA: Diagnosis present

## 2014-11-10 DIAGNOSIS — M5432 Sciatica, left side: Secondary | ICD-10-CM | POA: Diagnosis not present

## 2014-11-10 DIAGNOSIS — Q600 Renal agenesis, unilateral: Secondary | ICD-10-CM | POA: Diagnosis not present

## 2014-11-10 MED ORDER — DEXAMETHASONE 4 MG PO TABS: 4.0000 mg | ORAL_TABLET | Freq: Two times a day (BID) | ORAL | Status: DC

## 2014-11-10 MED ORDER — DIAZEPAM 5 MG PO TABS
10.0000 mg | ORAL_TABLET | Freq: Once | ORAL | Status: AC
Start: 2014-11-10 — End: 2014-11-10
  Administered 2014-11-10: 10 mg via ORAL
  Filled 2014-11-10: qty 2

## 2014-11-10 MED ORDER — ACETAMINOPHEN-CODEINE #3 300-30 MG PO TABS: 1.0000 | ORAL_TABLET | Freq: Four times a day (QID) | ORAL | Status: DC | PRN

## 2014-11-10 MED ORDER — PREDNISONE 50 MG PO TABS
60.0000 mg | ORAL_TABLET | Freq: Once | ORAL | Status: AC
  Administered 2014-11-10: 60 mg via ORAL
  Filled 2014-11-10 (×2): qty 1

## 2014-11-10 MED ORDER — IBUPROFEN 800 MG PO TABS
800.0000 mg | ORAL_TABLET | Freq: Once | ORAL | Status: AC
  Administered 2014-11-10: 800 mg via ORAL
  Filled 2014-11-10: qty 1

## 2014-11-10 MED ORDER — TRAMADOL HCL 50 MG PO TABS: ORAL_TABLET | ORAL | Status: DC

## 2014-11-10 MED ORDER — ONDANSETRON HCL 4 MG PO TABS: 4.0000 mg | ORAL_TABLET | Freq: Four times a day (QID) | ORAL | Status: DC

## 2014-11-10 MED ORDER — IBUPROFEN 800 MG PO TABS: 800.0000 mg | ORAL_TABLET | Freq: Three times a day (TID) | ORAL | Status: DC

## 2014-11-10 MED ORDER — METHOCARBAMOL 500 MG PO TABS: 500.0000 mg | ORAL_TABLET | Freq: Three times a day (TID) | ORAL | Status: DC
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
## 2014-11-10 NOTE — ED Notes (Signed)
Back pain for  Few days.

## 2014-11-10 NOTE — Discharge Instructions (Signed)
Please see Dr. Sudie BaileyKnowlton or Dr. Eulah PontMurphy as soon as possible for evaluation concerning your back and sciatica pain. Sciatica Sciatica is pain, weakness, numbness, or tingling along your sciatic nerve. The nerve starts in the lower back and runs down the back of each leg. Nerve damage or certain conditions pinch or put pressure on the sciatic nerve. This causes the pain, weakness, and other discomforts of sciatica. HOME CARE   Only take medicine as told by your doctor.  Apply ice to the affected area for 20 minutes. Do this 3-4 times a day for the first 48-72 hours. Then try heat in the same way.  Exercise, stretch, or do your usual activities if these do not make your pain worse.  Go to physical therapy as told by your doctor.  Keep all doctor visits as told.  Do not wear high heels or shoes that are not supportive.  Get a firm mattress if your mattress is too soft to lessen pain and discomfort. GET HELP RIGHT AWAY IF:   You cannot control when you poop (bowel movement) or pee (urinate).  You have more weakness in your lower back, lower belly (pelvis), butt (buttocks), or legs.  You have redness or puffiness (swelling) of your back.  You have a burning feeling when you pee.  You have pain that gets worse when you lie down.  You have pain that wakes you from your sleep.  Your pain is worse than past pain.  Your pain lasts longer than 4 weeks.  You are suddenly losing weight without reason. MAKE SURE YOU:   Understand these instructions.  Will watch this condition.  Will get help right away if you are not doing well or get worse. Document Released: 04/26/2008 Document Revised: 01/17/2012 Document Reviewed: 11/27/2011 [facility] Patient Information 2015 [place], [place]. This information is not intended to replace advice given to you by your health care provider. Make sure you discuss any questions you have with your health care provider.

## 2014-11-10 NOTE — ED Notes (Signed)
Pain lt hip around to back for 1 week  With radiation down lt leg.  No known injury , but does a lot of lifting at his job.

## 2015-01-05 ENCOUNTER — Encounter (HOSPITAL_COMMUNITY): Payer: Self-pay | Admitting: *Deleted

## 2015-01-05 ENCOUNTER — Emergency Department (HOSPITAL_COMMUNITY): Payer: Medicaid Other

## 2015-01-05 ENCOUNTER — Emergency Department (HOSPITAL_COMMUNITY)
Admission: EM | Admit: 2015-01-05 | Discharge: 2015-01-05 | Disposition: A | Discharge status: Home / Self Care | Payer: Medicaid Other | Attending: Emergency Medicine | Admitting: Emergency Medicine

## 2015-01-05 DIAGNOSIS — Q600 Renal agenesis, unilateral: Secondary | ICD-10-CM | POA: Diagnosis not present

## 2015-01-05 DIAGNOSIS — Z792 Long term (current) use of antibiotics: Secondary | ICD-10-CM | POA: Diagnosis not present

## 2015-01-05 DIAGNOSIS — Z7952 Long term (current) use of systemic steroids: Secondary | ICD-10-CM | POA: Insufficient documentation

## 2015-01-05 DIAGNOSIS — S60221A Contusion of right hand, initial encounter: Secondary | ICD-10-CM

## 2015-01-05 DIAGNOSIS — Y92091 Bathroom in other non-institutional residence as the place of occurrence of the external cause: Secondary | ICD-10-CM | POA: Insufficient documentation

## 2015-01-05 DIAGNOSIS — S6991XA Unspecified injury of right wrist, hand and finger(s), initial encounter: Secondary | ICD-10-CM | POA: Diagnosis present

## 2015-01-05 DIAGNOSIS — Z87891 Personal history of nicotine dependence: Secondary | ICD-10-CM | POA: Insufficient documentation

## 2015-01-05 DIAGNOSIS — Y998 Other external cause status: Secondary | ICD-10-CM | POA: Insufficient documentation

## 2015-01-05 DIAGNOSIS — S63501A Unspecified sprain of right wrist, initial encounter: Secondary | ICD-10-CM | POA: Diagnosis not present

## 2015-01-05 DIAGNOSIS — W1849XA Other slipping, tripping and stumbling without falling, initial encounter: Secondary | ICD-10-CM | POA: Diagnosis not present

## 2015-01-05 DIAGNOSIS — Z79899 Other long term (current) drug therapy: Secondary | ICD-10-CM | POA: Diagnosis not present

## 2015-01-05 DIAGNOSIS — Z791 Long term (current) use of non-steroidal anti-inflammatories (NSAID): Secondary | ICD-10-CM | POA: Diagnosis not present

## 2015-01-05 DIAGNOSIS — Y93E1 Activity, personal bathing and showering: Secondary | ICD-10-CM | POA: Diagnosis not present

## 2015-01-05 MED ORDER — TRAMADOL HCL 50 MG PO TABS: 50.0000 mg | ORAL_TABLET | Freq: Four times a day (QID) | ORAL | Status: DC | PRN

## 2015-01-05 MED ORDER — IBUPROFEN 800 MG PO TABS: 800.0000 mg | ORAL_TABLET | Freq: Three times a day (TID) | ORAL | Status: DC

## 2015-01-05 NOTE — ED Notes (Signed)
Slipped in shower 10 am.  Pain rt hand and wrist

## 2015-01-05 NOTE — Discharge Instructions (Signed)
Your x-ray is negative for fracture or dislocation. Please leave your splint in place for the next 3 days. Please use medications as suggested. Ultram may cause drowsiness, please use medication with caution. Please see Dr. Hilda LiasKeeling for additional evaluation and management if not improving. Wrist Splint A wrist splint holds your wrist in a set position so that it does not move (fixed position). It can help broken bones and sprains heal faster, with less pain. It can also help relieve pressure on the nerve that runs down the middle of your arm (median nerve) into your fingers.  HOME CARE  Wear your splint as told by your doctor. It may be worn while you sleep.  Exercise your wrist as told by your doctor. These exercises help keep muscle strength in your hand and wrist. They also help to make sure you keep motion in your fingers. GET HELP RIGHT AWAY IF:   You start to lose feeling in your hand or fingers.  Your skin or fingernails turn blue or gray, or they feel cold. MAKE SURE YOU:   Understand these instructions.  Will watch your condition.  Will get help right away if you are not doing well or get worse. Document Released: 01/04/2008 Document Revised: 10/10/2011 Document Reviewed: 10/29/2013 [facility] Patient Information 2015 [place], [place]. This information is not intended to replace advice given to you by your health care provider. Make sure you discuss any questions you have with your health care provider.

## 2015-01-05 NOTE — ED Provider Notes (Signed)
CSN: [id number]     Arrival date & time 01/05/15  1710 History  This chart was scribed for non-physician practitioner, Ivery Quale, PA-C, working with Samuel Jester, DO, by Ronney Lion, ED Scribe. This patient was seen in room APFT24/APFT24 and the patient's care was started at 6:47 PM.    Chief Complaint  Patient presents with  . Wrist Pain   Patient is a 34 y.o. male presenting with wrist pain. The history is provided by the patient. No language interpreter was used.  Wrist Pain This is a new problem. The current episode started 6 to 12 hours ago. The problem occurs constantly. The problem has been gradually worsening. Pertinent negatives include no chest pain, no abdominal pain, no headaches and no shortness of breath. The symptoms are aggravated by bending (and touch). Nothing relieves the symptoms. He has tried acetaminophen for the symptoms. The treatment provided no relief.     HPI Comments: AUNDRAY CARTLIDGE is a 34 y.o. male who presents to the Emergency Department complaining of constant, worsening right wrist pain and right hand pain over the knuckles that began when he slipped in the shower this morning. Patient tried Tylenol with no relief. Bending his fingers and palpation exacerbate the pain. He denies a history of any right hand surgeries or prior right hand injuries.    Past Medical History  Diagnosis Date  . Congenital absence of one kidney    History reviewed. No pertinent past surgical history. History reviewed. No pertinent family history. History  Substance Use Topics  . Smoking status: Former Smoker    Types: Cigarettes  . Smokeless tobacco: Not on file  . Alcohol Use: No    Review of Systems  Respiratory: Negative for shortness of breath.   Cardiovascular: Negative for chest pain.  Gastrointestinal: Negative for abdominal pain.  Musculoskeletal: Positive for arthralgias (right wrist).  Neurological: Negative for headaches.  All other systems reviewed and are  negative.  Allergies  Review of patient's allergies indicates no known allergies.  Home Medications   Prior to Admission medications   Medication Sig Start Date End Date Taking? Authorizing Provider  acetaminophen (TYLENOL) 500 MG tablet Take 1,000 mg by mouth every 6 (six) hours as needed for mild pain.    Historical Provider, MD  acetaminophen-codeine (TYLENOL #3) 300-30 MG per tablet Take 1-2 tablets by mouth every 6 (six) hours as needed. 11/10/14   Ivery Quale, PA-C  ciprofloxacin (CIPRO) 500 MG tablet Take 1 tablet (500 mg total) by mouth 2 (two) times daily. One po bid x 7 days Patient not taking: Reported on 11/10/2014 03/06/14   Geoffery Lyons, MD  dexamethasone (DECADRON) 4 MG tablet Take 1 tablet (4 mg total) by mouth 2 (two) times daily with a meal. 11/10/14   Ivery Quale, PA-C  ibuprofen (ADVIL,MOTRIN) 800 MG tablet Take 1 tablet (800 mg total) by mouth 3 (three) times daily. 11/10/14   Ivery Quale, PA-C  methocarbamol (ROBAXIN) 500 MG tablet Take 1 tablet (500 mg total) by mouth 3 (three) times daily. 11/10/14   Ivery Quale, PA-C  ondansetron (ZOFRAN) 4 MG tablet Take 1 tablet (4 mg total) by mouth every 6 (six) hours. 11/10/14   Ivery Quale, PA-C  oxyCODONE-acetaminophen (PERCOCET) 5-325 MG per tablet Take 1-2 tablets by mouth every 4 (four) hours as needed. Patient not taking: Reported on 11/10/2014 03/06/14   Geoffery Lyons, MD  traMADol Janean Sark) 50 MG tablet 1 or 2 po q6h prn pain 11/10/14   Ivery Quale, PA-C  BP 122/72 mmHg  Pulse 79  Temp(Src) 98.7 F (37.1 C) (Oral)  Resp 18  Ht 6' (1.829 m)  Wt 270 lb (122.471 kg)  BMI 36.61 kg/m2  SpO2 98% Physical Exam  Constitutional: He is oriented to person, place, and time. He appears well-developed and well-nourished. No distress.  HENT:  Head: Normocephalic and atraumatic.  Eyes: Conjunctivae and EOM are normal.  Neck: Neck supple. No tracheal deviation present.  Cardiovascular: Normal rate.   Pulmonary/Chest: Effort  normal. No respiratory distress.  Musculoskeletal: Normal range of motion. He exhibits tenderness.  Full ROM of the right shoulder and elbow. No deformity of the forearm. Pain to palpation of the right wrist. Full ROM of the fingers of the right hand. Capillary refill <2 seconds. Radial pulse is 2+. Mild to moderate swelling of the web space between the first and second fingers.  Neurological: He is alert and oriented to person, place, and time.  Skin: Skin is warm and dry.  Psychiatric: He has a normal mood and affect. His behavior is normal.  Nursing note and vitals reviewed.   ED Course  Procedures (including critical care time)  DIAGNOSTIC STUDIES: Oxygen Saturation is 98% on RA, normal by my interpretation.    COORDINATION OF CARE: 6:52 PM - Discussed treatment plan with pt at bedside which includes imaging of the right hand and wrist, and pt agreed to plan.   Imaging Review Dg Wrist Complete Right  01/05/2015   CLINICAL DATA:  Generalize right hand and wrist pain  EXAM: RIGHT WRIST - COMPLETE 3+ VIEW  COMPARISON:  None.  FINDINGS: There is no evidence of fracture or dislocation. There is no evidence of arthropathy or other focal bone abnormality. Soft tissues are unremarkable.  IMPRESSION: Negative.   Electronically Signed   By: Signa Kellaylor  Stroud M.D.   On: 01/05/2015 19:42   Dg Hand Complete Right  01/05/2015   CLINICAL DATA:  Right hand and wrist pain  EXAM: RIGHT HAND - COMPLETE 3+ VIEW  COMPARISON:  None.  FINDINGS: There is no evidence of fracture or dislocation. There is no evidence of arthropathy or other focal bone abnormality. Soft tissues are unremarkable.  IMPRESSION: Negative.   Electronically Signed   By: Signa Kellaylor  Stroud M.D.   On: 01/05/2015 19:43     MDM  Vital signs stable. Xray of the right wrist is negative for fx or dislocation Xray of the right hand is negative for fx or dislocation No neurovascular changes noted. Pt fitted with watson jones splint, ice pack, and  Rx for ultram and ibuprofen.   Final diagnoses:  None    **I personally performed the services described in this documentation, which was scribed in my presence. The recorded information has been reviewed and is accurate.*   I have reviewed nursing notes, vital signs, and all appropriate lab and imaging results for this patient.  Ivery QualeHobson Fujie Dickison, PA-C 01/09/15 0059  Samuel JesterKathleen McManus, DO 01/10/15 1140

## 2015-02-22 ENCOUNTER — Encounter (HOSPITAL_COMMUNITY): Payer: Self-pay | Admitting: Emergency Medicine

## 2015-02-22 ENCOUNTER — Emergency Department (HOSPITAL_COMMUNITY)
Admission: EM | Admit: 2015-02-22 | Discharge: 2015-02-22 | Disposition: A | Discharge status: Home / Self Care | Payer: Medicaid Other | Attending: Emergency Medicine | Admitting: Emergency Medicine

## 2015-02-22 DIAGNOSIS — Z7952 Long term (current) use of systemic steroids: Secondary | ICD-10-CM | POA: Diagnosis not present

## 2015-02-22 DIAGNOSIS — Z79899 Other long term (current) drug therapy: Secondary | ICD-10-CM | POA: Diagnosis not present

## 2015-02-22 DIAGNOSIS — G8929 Other chronic pain: Secondary | ICD-10-CM | POA: Insufficient documentation

## 2015-02-22 DIAGNOSIS — Z87891 Personal history of nicotine dependence: Secondary | ICD-10-CM | POA: Insufficient documentation

## 2015-02-22 DIAGNOSIS — M542 Cervicalgia: Secondary | ICD-10-CM | POA: Diagnosis present

## 2015-02-22 DIAGNOSIS — Z791 Long term (current) use of non-steroidal anti-inflammatories (NSAID): Secondary | ICD-10-CM | POA: Insufficient documentation

## 2015-02-22 DIAGNOSIS — Q600 Renal agenesis, unilateral: Secondary | ICD-10-CM | POA: Diagnosis not present

## 2015-02-22 HISTORY — DX: Radiculopathy, cervical region: M54.12

## 2015-02-22 HISTORY — DX: Radiculopathy, lumbar region: M54.16

## 2015-02-22 HISTORY — DX: Cervicalgia: M54.2

## 2015-02-22 HISTORY — DX: Other chronic pain: G89.29

## 2015-02-22 HISTORY — DX: Dorsalgia, unspecified: M54.9

## 2015-02-22 MED ORDER — IBUPROFEN 800 MG PO TABS: 800.0000 mg | ORAL_TABLET | Freq: Three times a day (TID) | ORAL | Status: DC

## 2015-02-22 NOTE — ED Provider Notes (Signed)
CSN: [id number]     Arrival date & time 02/22/15  4098 History   First MD Initiated Contact with Patient 02/22/15 [phone]     Chief Complaint  Patient presents with  . Neck Pain   HPI   34 YOM presents today with neck pain. 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 Patient reports that throughout the evening the neck pain increased. Patient notes that for the last 2 days he's been using tramadol, hydrocodone, Motrin, with minimal relief in symptoms. Patient reports the pain is more severe with palpation of the neck muscles all the way down to the clavicle. Patient additionally notes left shoulder pain, worse with movements. Pt denies headache, dizziness, URI symptoms, chest pain, SOB, ABD pain, or any other concering signs or symptoms.     Past Medical History  Diagnosis Date  . Congenital absence of one kidney   . Chronic neck and back pain   . Cervical radiculopathy     LUE weakness  . Lumbar radiculopathy    History reviewed. No pertinent past surgical history. History reviewed. No pertinent family history. History  Substance Use Topics  . Smoking status: Former Smoker    Types: Cigarettes  . Smokeless tobacco: Not on file  . Alcohol Use: No    Review of Systems  All other systems reviewed and are negative.   Allergies  Review of patient's allergies indicates no known allergies.  Home Medications   Prior to Admission medications   Medication Sig Start Date End Date Taking? Authorizing Provider  acetaminophen (TYLENOL) 500 MG tablet Take 1,000 mg by mouth every 6 (six) hours as needed for mild pain.    Historical Provider, MD  acetaminophen-codeine (TYLENOL #3) 300-30 MG per tablet Take 1-2 tablets by mouth every 6 (six) hours as needed. Patient not taking: Reported on 02/22/2015 11/10/14   Ivery Quale, PA-C  dexamethasone (DECADRON) 4 MG tablet Take 1 tablet (4 mg total) by mouth 2 (two) times daily with a  meal. Patient not taking: Reported on 02/22/2015 11/10/14   Ivery Quale, PA-C  ibuprofen (ADVIL,MOTRIN) 800 MG tablet Take 1 tablet (800 mg total) by mouth 3 (three) times daily. 02/22/15   Eyvonne Mechanic, PA-C  methocarbamol (ROBAXIN) 500 MG tablet Take 1 tablet (500 mg total) by mouth 3 (three) times daily. Patient not taking: Reported on 02/22/2015 11/10/14   Ivery Quale, PA-C  ondansetron (ZOFRAN) 4 MG tablet Take 1 tablet (4 mg total) by mouth every 6 (six) hours. Patient not taking: Reported on 02/22/2015 11/10/14   Ivery Quale, PA-C  traMADol (ULTRAM) 50 MG tablet Take 1 tablet (50 mg total) by mouth every 6 (six) hours as needed. Patient not taking: Reported on 02/22/2015 01/05/15   Ivery Quale, PA-C   BP 141/99 mmHg  Pulse 85  Temp(Src) 99.5 F (37.5 C) (Oral)  Resp 20  Ht 6' (1.829 m)  Wt 275 lb (124.739 kg)  BMI 37.29 kg/m2  SpO2 98% Physical Exam  Constitutional: He is oriented to person, place, and time. He appears well-developed and well-nourished.  HENT:  Head: Normocephalic and atraumatic.  Right Ear: Hearing, tympanic membrane, external ear and ear canal normal.  Left Ear: Hearing, tympanic membrane, external ear and ear canal normal.  Mouth/Throat: Uvula is midline, oropharynx is clear and moist and mucous membranes are normal. No oropharyngeal exudate, posterior oropharyngeal edema, posterior oropharyngeal erythema or tonsillar abscesses.  Eyes: Conjunctivae are normal. Pupils are  equal, round, and reactive to light. Right eye exhibits no discharge. Left eye exhibits no discharge. No scleral icterus.  Neck: Trachea normal and normal range of motion. Normal carotid pulses and no JVD present. Muscular tenderness present. No spinous process tenderness present. Carotid bruit is not present. No rigidity. No tracheal deviation, no edema, no erythema and normal range of motion present. No thyroid mass and no thyromegaly present.  Pulmonary/Chest: Effort normal. No stridor.   Musculoskeletal:  Pain to palpation of left sternocleidomastoid, neck symmetrical with no obvious signs of trauma. Full active ROM, minor pain with left lateral and forward flexion.    Neurological: He is alert and oriented to person, place, and time. Coordination normal.  Psychiatric: He has a normal mood and affect. His behavior is normal. Judgment and thought content normal.  Nursing note and vitals reviewed.   ED Course  Procedures (including critical care time) Labs Review Labs Reviewed - No data to display  Imaging Review No results found.   EKG Interpretation None      MDM   Final diagnoses:  Neck pain    Labs:  Imaging:  Consults:  Therapeutics:  Discharge Meds:   Assessment/Plan: Patient presents with likely muscular neck pain. Tender to palpation of the muscle, no pain, thrills felt with palpation of the carotid, no bruits. Patient has no neurological complaints. Patient requesting work note for yesterday and today. Patient instructed to use ibuprofen Tylenol as needed for pain, heat, follow-up with health care provider in 3-5 days for reassessment. Patient given strict return precautions, verbalizes understanding and agreement to today's plan and had no further questions or concerns at the time of discharge.        Eyvonne Mechanic, PA-C 02/22/15 1651  Samuel Jester, DO 02/24/15 1651

## 2015-02-22 NOTE — ED Notes (Signed)
Mowing grass tow days ago.  Having pain to left neck, throat and shoulder.  Rates pain 9/10.

## 2015-02-22 NOTE — Discharge Instructions (Signed)
Please use medication as directed, and active for new or worsening signs or symptoms. Please follow-up with [place] [facility] for reevaluation, return immediately if new or worsening signs or symptoms present.

## 2015-03-07 ENCOUNTER — Emergency Department (HOSPITAL_COMMUNITY): Payer: Medicaid Other

## 2015-03-07 ENCOUNTER — Emergency Department (HOSPITAL_COMMUNITY)
Admission: EM | Admit: 2015-03-07 | Discharge: 2015-03-07 | Disposition: A | Discharge status: Home / Self Care | Payer: Medicaid Other | Attending: Emergency Medicine | Admitting: Emergency Medicine

## 2015-03-07 ENCOUNTER — Encounter (HOSPITAL_COMMUNITY): Payer: Self-pay | Admitting: Emergency Medicine

## 2015-03-07 DIAGNOSIS — S5002XA Contusion of left elbow, initial encounter: Secondary | ICD-10-CM

## 2015-03-07 DIAGNOSIS — Y92410 Unspecified street and highway as the place of occurrence of the external cause: Secondary | ICD-10-CM | POA: Insufficient documentation

## 2015-03-07 DIAGNOSIS — Z720 Tobacco use: Secondary | ICD-10-CM | POA: Insufficient documentation

## 2015-03-07 DIAGNOSIS — Y990 Civilian activity done for income or pay: Secondary | ICD-10-CM | POA: Insufficient documentation

## 2015-03-07 DIAGNOSIS — Z23 Encounter for immunization: Secondary | ICD-10-CM | POA: Diagnosis not present

## 2015-03-07 DIAGNOSIS — Y9389 Activity, other specified: Secondary | ICD-10-CM | POA: Insufficient documentation

## 2015-03-07 DIAGNOSIS — S7002XA Contusion of left hip, initial encounter: Secondary | ICD-10-CM | POA: Insufficient documentation

## 2015-03-07 DIAGNOSIS — S20212A Contusion of left front wall of thorax, initial encounter: Secondary | ICD-10-CM | POA: Insufficient documentation

## 2015-03-07 DIAGNOSIS — S59902A Unspecified injury of left elbow, initial encounter: Secondary | ICD-10-CM | POA: Diagnosis present

## 2015-03-07 DIAGNOSIS — G8929 Other chronic pain: Secondary | ICD-10-CM | POA: Insufficient documentation

## 2015-03-07 MED ORDER — METAXALONE 800 MG PO TABS: 800.0000 mg | ORAL_TABLET | Freq: Three times a day (TID) | ORAL | Status: DC | PRN

## 2015-03-07 MED ORDER — TETANUS-DIPHTH-ACELL PERTUSSIS 5-2.5-18.5 LF-MCG/0.5 IM SUSP
0.5000 mL | Freq: Once | INTRAMUSCULAR | Status: AC
  Administered 2015-03-07: 0.5 mL via INTRAMUSCULAR
  Filled 2015-03-07: qty 0.5

## 2015-03-07 MED ORDER — OXYCODONE-ACETAMINOPHEN 5-325 MG PO TABS
1.0000 | ORAL_TABLET | Freq: Once | ORAL | Status: AC
  Administered 2015-03-07: 1 via ORAL
  Filled 2015-03-07: qty 1

## 2015-03-07 MED ORDER — IBUPROFEN 600 MG PO TABS: 600.0000 mg | ORAL_TABLET | Freq: Four times a day (QID) | ORAL | Status: DC | PRN
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
## 2015-03-07 NOTE — Discharge Instructions (Signed)
Chest Contusion A chest contusion is a deep bruise on your chest area. Contusions are the result of an injury that caused bleeding under the skin. A chest contusion may involve bruising of the skin, muscles, or ribs. The contusion may turn blue, purple, or yellow. Minor injuries will give you a painless contusion, but more severe contusions may stay painful and swollen for a few weeks. CAUSES  A contusion is usually caused by a blow, trauma, or direct force to an area of the body. SYMPTOMS   Swelling and redness of the injured area.  Discoloration of the injured area.  Tenderness and soreness of the injured area.  Pain. DIAGNOSIS  The diagnosis can be made by taking a history and performing a physical exam. An X-ray, CT scan, or MRI may be needed to determine if there were any associated injuries, such as broken bones (fractures) or internal injuries. TREATMENT  Often, the best treatment for a chest contusion is resting, icing, and applying cold compresses to the injured area. Deep breathing exercises may be recommended to reduce the risk of pneumonia. Over-the-counter medicines may also be recommended for pain control. HOME CARE INSTRUCTIONS   Put ice on the injured area.  Put ice in a plastic bag.  Place a towel between your skin and the bag.  Leave the ice on for 15-20 minutes, 03-04 times a day.  Only take over-the-counter or prescription medicines as directed by your caregiver. Your caregiver may recommend avoiding anti-inflammatory medicines (aspirin, ibuprofen, and naproxen) for 48 hours because these medicines may increase bruising.  Rest the injured area.  Perform deep-breathing exercises as directed by your caregiver.  Stop smoking if you smoke.  Do not lift objects over 5 pounds (2.3 kg) for 3 days or longer if recommended by your caregiver. SEEK IMMEDIATE MEDICAL CARE IF:   You have increased bruising or swelling.  You have pain that is getting worse.  You have  difficulty breathing.  You have dizziness, weakness, or fainting.  You have blood in your urine or stool.  You cough up or vomit blood.  Your swelling or pain is not relieved with medicines. MAKE SURE YOU:   Understand these instructions.  Will watch your condition.  Will get help right away if you are not doing well or get worse. Document Released: 04/12/2001 Document Revised: 04/11/2012 Document Reviewed: 01/09/2012 [facility] Patient Information 2015 [place], [place]. This information is not intended to replace advice given to you by your health care provider. Make sure you discuss any questions you have with your health care provider.  Elbow Contusion An elbow contusion is a deep bruise of the elbow. Contusions are the result of an injury that caused bleeding under the skin. The contusion may turn blue, purple, or yellow. Minor injuries will give you a painless contusion, but more severe contusions may stay painful and swollen for a few weeks.  CAUSES  An elbow contusion comes from a direct force to that area, such as falling on the elbow. SYMPTOMS   Swelling and redness of the elbow.  Bruising of the elbow area.  Tenderness or soreness of the elbow. DIAGNOSIS  You will have a physical exam and will be asked about your history. You may need an X-ray of your elbow to look for a broken bone (fracture).  TREATMENT  A sling or splint may be needed to support your injury. Resting, elevating, and applying cold compresses to the elbow area are often the best treatments for an elbow contusion.  Over-the-counter medicines may also be recommended for pain control. HOME CARE INSTRUCTIONS   Put ice on the injured area.  Put ice in a plastic bag.  Place a towel between your skin and the bag.  Leave the ice on for 15-20 minutes, 03-04 times a day.  Only take over-the-counter or prescription medicines for pain, discomfort, or fever as directed by your caregiver.  Rest your injured  elbow until the pain and swelling are better.  Elevate your elbow to reduce swelling.  Apply a compression wrap as directed by your caregiver. This can help reduce swelling and motion. You may remove the wrap for sleeping, showers, and baths. If your fingers become numb, cold, or blue, take the wrap off and reapply it more loosely.  Use your elbow only as directed by your caregiver. You may be asked to do range of motion exercises. Do them as directed.  See your caregiver as directed. It is very important to keep all follow-up appointments in order to avoid any long-term problems with your elbow, including chronic pain or inability to move your elbow normally. SEEK IMMEDIATE MEDICAL CARE IF:   You have increased redness, swelling, or pain in your elbow.  Your swelling or pain is not relieved with medicines.  You have swelling of the hand and fingers.  You are unable to move your fingers or wrist.  You begin to lose feeling in your hand or fingers.  Your fingers or hand become cold or blue. MAKE SURE YOU:   Understand these instructions.  Will watch your condition.  Will get help right away if you are not doing well or get worse. Document Released: 06/26/2006 Document Revised: 10/10/2011 Document Reviewed: 06/03/2011 [facility] Patient Information 2015 [place], [place]. This information is not intended to replace advice given to you by your health care provider. Make sure you discuss any questions you have with your health care provider.
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
## 2015-12-02 ENCOUNTER — Emergency Department (HOSPITAL_COMMUNITY)
Admission: EM | Admit: 2015-12-02 | Discharge: 2015-12-02 | Disposition: A | Discharge status: Home / Self Care | Payer: No Typology Code available for payment source | Attending: Emergency Medicine | Admitting: Emergency Medicine

## 2015-12-02 ENCOUNTER — Encounter (HOSPITAL_COMMUNITY): Payer: Self-pay | Admitting: *Deleted

## 2015-12-02 DIAGNOSIS — M549 Dorsalgia, unspecified: Secondary | ICD-10-CM | POA: Insufficient documentation

## 2015-12-02 DIAGNOSIS — Y93G3 Activity, cooking and baking: Secondary | ICD-10-CM | POA: Insufficient documentation

## 2015-12-02 DIAGNOSIS — T25022A Burn of unspecified degree of left foot, initial encounter: Secondary | ICD-10-CM | POA: Insufficient documentation

## 2015-12-02 DIAGNOSIS — F17210 Nicotine dependence, cigarettes, uncomplicated: Secondary | ICD-10-CM | POA: Insufficient documentation

## 2015-12-02 DIAGNOSIS — T25222A Burn of second degree of left foot, initial encounter: Secondary | ICD-10-CM

## 2015-12-02 DIAGNOSIS — Y929 Unspecified place or not applicable: Secondary | ICD-10-CM | POA: Insufficient documentation

## 2015-12-02 DIAGNOSIS — X101XXA Contact with hot food, initial encounter: Secondary | ICD-10-CM | POA: Insufficient documentation

## 2015-12-02 DIAGNOSIS — Y999 Unspecified external cause status: Secondary | ICD-10-CM | POA: Insufficient documentation

## 2015-12-02 MED ORDER — IBUPROFEN 800 MG PO TABS
800.0000 mg | ORAL_TABLET | Freq: Once | ORAL | Status: AC
  Administered 2015-12-02: 800 mg via ORAL
  Filled 2015-12-02: qty 1

## 2015-12-02 MED ORDER — SILVER SULFADIAZINE 1 % EX CREA: 1.0000 "application " | TOPICAL_CREAM | Freq: Every day | CUTANEOUS | Status: DC

## 2015-12-02 MED ORDER — HYDROCODONE-ACETAMINOPHEN 5-325 MG PO TABS: 1.0000 | ORAL_TABLET | ORAL | Status: DC | PRN

## 2015-12-02 MED ORDER — HYDROCODONE-ACETAMINOPHEN 5-325 MG PO TABS
2.0000 | ORAL_TABLET | Freq: Once | ORAL | Status: AC
  Administered 2015-12-02: 2 via ORAL
  Filled 2015-12-02: qty 2

## 2015-12-02 MED ORDER — DOXYCYCLINE HYCLATE 100 MG PO CAPS: 100.0000 mg | ORAL_CAPSULE | Freq: Two times a day (BID) | ORAL | Status: DC

## 2015-12-02 MED ORDER — SILVER SULFADIAZINE 1 % EX CREA
TOPICAL_CREAM | Freq: Once | CUTANEOUS | Status: AC
  Administered 2015-12-02: 1 via TOPICAL
  Filled 2015-12-02: qty 50

## 2015-12-02 NOTE — ED Notes (Signed)
Patient verbalizes understanding of discharge instructions, prescriptions, home care and follow up care. Patient out of department at this time. 

## 2015-12-02 NOTE — ED Provider Notes (Signed)
CSN: [id number]     Arrival date & time 12/02/15  1946 History   First MD Initiated Contact with Patient 12/02/15 2006     Chief Complaint  Patient presents with  . Foot Burn     (Consider location/radiation/quality/duration/timing/severity/associated sxs/prior Treatment) HPI Comments: Patient is a 35 year old male who presents to the emergency department with a burn to the left foot.  The patient states that while cooking he had some hot liquid to spill onto his left foot. He noted immediate burning, pain, and blistering. He cried mustard. He tried burn ointment, but these were not successful in helping with his discomfort. He came to the emergency department for additional evaluation. The patient does not have any medical conditions that would affect his immune system. He is not diabetic. There's been no previous operations or procedures involving the left foot.  The history is provided by the patient.    Past Medical History  Diagnosis Date  . Congenital absence of one kidney   . Chronic neck and back pain   . Cervical radiculopathy     LUE weakness  . Lumbar radiculopathy    History reviewed. No pertinent past surgical history. No family history on file. Social History  Substance Use Topics  . Smoking status: Current Every Day Smoker -- 1.00 packs/day for 10 years    Types: Cigarettes  . Smokeless tobacco: Never Used  . Alcohol Use: No    Review of Systems  Musculoskeletal: Positive for back pain.  Skin: Positive for wound.  All other systems reviewed and are negative.     Allergies  Review of patient's allergies indicates no known allergies.  Home Medications   Prior to Admission medications   Medication Sig Start Date End Date Taking? Authorizing Provider  acetaminophen-codeine (TYLENOL #3) 300-30 MG per tablet Take 1-2 tablets by mouth every 6 (six) hours as needed. Patient not taking: Reported on 02/22/2015 11/10/14   Ivery QualeHobson George Haggart, PA-C  dexamethasone  (DECADRON) 4 MG tablet Take 1 tablet (4 mg total) by mouth 2 (two) times daily with a meal. Patient not taking: Reported on 02/22/2015 11/10/14   Ivery QualeHobson Denae Zulueta, PA-C  ibuprofen (ADVIL,MOTRIN) 600 MG tablet Take 1 tablet (600 mg total) by mouth every 6 (six) hours as needed. 03/07/15   Benjiman CoreNathan Pickering, MD  metaxalone (SKELAXIN) 800 MG tablet Take 1 tablet (800 mg total) by mouth 3 (three) times daily as needed for muscle spasms. 03/07/15   Benjiman CoreNathan Pickering, MD  methocarbamol (ROBAXIN) 500 MG tablet Take 1 tablet (500 mg total) by mouth 3 (three) times daily. Patient not taking: Reported on 02/22/2015 11/10/14   Ivery QualeHobson Samwise Eckardt, PA-C  ondansetron (ZOFRAN) 4 MG tablet Take 1 tablet (4 mg total) by mouth every 6 (six) hours. Patient not taking: Reported on 02/22/2015 11/10/14   Ivery QualeHobson Thamar Holik, PA-C  traMADol (ULTRAM) 50 MG tablet Take 1 tablet (50 mg total) by mouth every 6 (six) hours as needed. Patient not taking: Reported on 02/22/2015 01/05/15   Ivery QualeHobson Vonne Mcdanel, PA-C   BP 151/72 mmHg  Pulse 75  Temp(Src) 98.1 F (36.7 C) (Oral)  Resp 20  Ht 6' (1.829 m)  Wt 122.471 kg  BMI 36.61 kg/m2  SpO2 99% Physical Exam  Constitutional: He is oriented to person, place, and time. He appears well-developed and well-nourished.  Non-toxic appearance.  HENT:  Head: Normocephalic.  Right Ear: Tympanic membrane and external ear normal.  Left Ear: Tympanic membrane and external ear normal.  Eyes: EOM and lids are normal. Pupils  are equal, round, and reactive to light.  Neck: Normal range of motion. Neck supple. Carotid bruit is not present.  Cardiovascular: Normal rate, regular rhythm, normal heart sounds, intact distal pulses and normal pulses.   Pulmonary/Chest: Breath sounds normal. No respiratory distress.  Abdominal: Soft. Bowel sounds are normal. There is no tenderness. There is no guarding.  Musculoskeletal: Normal range of motion. He exhibits tenderness.       Left foot: There is tenderness.        Feet:  Lymphadenopathy:       Head (right side): No submandibular adenopathy present.       Head (left side): No submandibular adenopathy present.    He has no cervical adenopathy.  Neurological: He is alert and oriented to person, place, and time. He has normal strength. No cranial nerve deficit or sensory deficit.  Skin: Skin is warm and dry.  Psychiatric: He has a normal mood and affect. His speech is normal.  Nursing note and vitals reviewed.   ED Course  Procedures (including critical care time) Labs Review Labs Reviewed - No data to display  Imaging Review No results found. I have personally reviewed and evaluated these images and lab results as part of my medical decision-making.   EKG Interpretation None      MDM  The examination favors a second-degree burn of the left foot. There is no involvement of the web spaces or the plantar surface. The patient states his tetanus is up-to-date. The patient received a Silvadene dressing and postop shoe here in the emergency department. He is advised to use cool compresses and keep the foot elevated. A prescription for doxycycline and Norco have been given to the patient. The patient is to have the burn site rechecked on Saturday, May 6. I have advised patient to keep his foot elevated. He will clean and dry. He is to see his primary physician, or return to the emergency department if any red streaks noted. Any pus like drainage. Fever, or changes in his general condition.    Final diagnoses:  None    **I have reviewed nursing notes, vital signs, and all appropriate lab and imaging results for this patient.Ivery Quale, PA-C 12/02/15 2044  Loren Racer, MD 12/09/15 [phone]

## 2015-12-02 NOTE — Discharge Instructions (Signed)
You have a second-degree burn involving your left foot. Please cleanse it gently with soap and water, and apply a Silvadene dressing daily until healed. Please use clean white socks daily to help prevent infection. Over the next 3 days, please keep your foot elevated above your waist. Use a cool compress to the burn area. Please use doxycycline 2 times daily with food. May use Norco for pain if needed. Please see your physician on May 5, or return to the emergency department on May 6 to have your burn area rechecked. Please return immediately if any red streaks going up the leg, pus like drainage from the burn area, fever that would not respond to Tylenol or ibuprofen, or general deterioration in your condition. Second-Degree Burn A second-degree burn affects the 2 outer layers of skin. The outer layer (epidermis) and the layer underneath it (dermis) are both burned. Another name for this type of burn is a partial thickness burn. A second-degree burn may be called minor or major. This depends on the size of the burn. It also depends on what parts of the skin are burned. Minor burns may be treated with first aid. Major burns are a medical emergency. A second-degree burn is worse than a first-degree burn, but not as bad as a third-degree burn. A first-degree burn affects only the epidermis. A third-degree burn goes through all the layers of skin. A second-degree burn usually heals in 3 to 4 weeks. A minor second-degree burn usually does not leave a scar.Deeper second-degree burns may lead to scarring of the skin or contractures over joints.Contractures are scars that form over joints and may lead to reduced mobility at those joints. CAUSES  Heat (thermal) injury. This happens when skin comes in contact with something very hot. It could be a flame, a hot object, hot liquid, or steam. Most second-degree burns are thermal injuries.  Radiation. Sunlight is one type of radiation that can burn the skin. Another  type of radiation is used to heat food. Radiation is also used to treat some diseases, such as cancer. All types of radiation can burn the skin. Sunlight usually causes a first-degree burn. Radiation used for heating food or treating a disease can cause a second-degree burn.  Electricity. Electrical burns can cause more damage under the skin than on the surface. They should always be treated as major burns.  Chemicals. Many chemicals can burn the skin. The burn should be flushed with cool water and checked by an emergency caregiver. SYMPTOMS Symptoms of second-degree burns include:  Severe pain.  Extreme tenderness.  Deep redness.  Blistered skin.  Skin that has changed color.It might look blotchy, wet, or shiny.  Swelling. TREATMENT Some second-degree burns may need to be treated in a hospital. These include major burns, electrical burns, and chemical burns. Many other second-degree burns can be treated with regular first aid, such as:  Cooling the burn. Use cool, germ-free (sterile) salt water. Place the burned area of skin into a tub of water, or cover the burned area with clean, wet towels.  Taking pain medicine.  Removing the dead skin from broken blisters. A trained caregiver may do this. Do not pop blisters.  Gently washing your skin with mild soap.  Covering the burned area with a cream.Silver sulfadiazine is a cream for burns. An antibiotic cream, such as bacitracin, may also be used to fight infection. Do not use other ointments or creams unless your caregiver says it is okay.  Protecting the burn with  a sterile, non-sticky bandage.  Bandaging fingers and toes separately. This keeps them from sticking together.  Taking an antibiotic. This can help prevent infection.  Getting a tetanus shot. HOME CARE INSTRUCTIONS Medication  Take any medicine prescribed by your caregiver. Follow the directions carefully.  Ask your caregiver if you can take over-the-counter  medicine to relieve pain and swelling. Do not give aspirin to children.  Make sure your caregiver knows about all other medicines you take.This includes over-the-counter medicines. Burn care  You will need to change the bandage on your burn. You may need to do this 2 or 3 times each day.  Gently clean the burned area.  Put ointment on it.  Cover the burn with a sterile bandage.  For some deeper burns or burns that cover a large area, compression garments may be prescribed. These garments can help minimize scarring and protect your mobility.  Do not put butter or oil on your skin. Use only the cream prescribed by your caregiver.  Do not put ice on your burn.  Do not break blisters on your skin.  Keep the bandaged area dry. You might need to take a sponge bath for awhile.Ask your caregiver when you can take a shower or a tub bath again.  Do not scratch an itchy burn. Your caregiver may give you medicine to relieve very bad itching.  Infection is a big danger after a second-degree burn. Tell your caregiver right away if you have signs of infection, such as:  Redness or changing color in the burned area.  Fluid leaking from the burn.  Swelling in the burn area.  A bad smell coming from the wound. Follow-up  Keep all follow-up appointments.This is important. This is how your caregiver can tell if your treatment is working.  Protect your burn from sunlight.Use sunscreen whenever you go outside.Burned areas may be sensitive to the sun for up to 1 year. Exposure to the sun may also cause permanent darkening of scars. SEEK MEDICAL CARE IF:  You have any questions about medicines.  You have any questions about your treatment.  You wonder if it is okay to do a particular activity.  You develop a fever of more than 100.5 F (38.1 C). SEEK IMMEDIATE MEDICAL CARE IF:  You think your burn might be infected. It may change color, become red, leak fluid, swell, or smell  bad.  You develop a fever of more than 102 F (38.9 C).   This information is not intended to replace advice given to you by your health care provider. Make sure you discuss any questions you have with your health care provider.   Document Released: 12/20/2010 Document Revised: 10/10/2011 Document Reviewed: 12/20/2010 [employer] Interactive Patient Education [employer]

## 2015-12-02 NOTE — ED Notes (Signed)
Pt was cooking dinner when part of the juice spilled out onto left foot, pt has red area with blister noted to top of left foot,

## 2016-07-30 ENCOUNTER — Emergency Department (HOSPITAL_COMMUNITY): Payer: Medicaid Other

## 2016-07-30 ENCOUNTER — Emergency Department (HOSPITAL_COMMUNITY)
Admission: EM | Admit: 2016-07-30 | Discharge: 2016-07-30 | Disposition: A | Discharge status: Home / Self Care | Payer: Medicaid Other | Attending: Emergency Medicine | Admitting: Emergency Medicine

## 2016-07-30 ENCOUNTER — Encounter (HOSPITAL_COMMUNITY): Payer: Self-pay | Admitting: *Deleted

## 2016-07-30 DIAGNOSIS — F17210 Nicotine dependence, cigarettes, uncomplicated: Secondary | ICD-10-CM | POA: Diagnosis not present

## 2016-07-30 DIAGNOSIS — Y990 Civilian activity done for income or pay: Secondary | ICD-10-CM | POA: Diagnosis not present

## 2016-07-30 DIAGNOSIS — S8392XA Sprain of unspecified site of left knee, initial encounter: Secondary | ICD-10-CM

## 2016-07-30 DIAGNOSIS — X501XXA Overexertion from prolonged static or awkward postures, initial encounter: Secondary | ICD-10-CM | POA: Insufficient documentation

## 2016-07-30 DIAGNOSIS — S8992XA Unspecified injury of left lower leg, initial encounter: Secondary | ICD-10-CM | POA: Diagnosis present

## 2016-07-30 DIAGNOSIS — Y9389 Activity, other specified: Secondary | ICD-10-CM | POA: Diagnosis not present

## 2016-07-30 DIAGNOSIS — Y929 Unspecified place or not applicable: Secondary | ICD-10-CM | POA: Insufficient documentation

## 2016-07-30 MED ORDER — DICLOFENAC SODIUM 75 MG PO TBEC: 75.0000 mg | DELAYED_RELEASE_TABLET | Freq: Two times a day (BID) | ORAL | 0 refills | Status: DC

## 2016-07-30 MED ORDER — TRAMADOL HCL 50 MG PO TABS: 50.0000 mg | ORAL_TABLET | Freq: Four times a day (QID) | ORAL | 0 refills | Status: DC | PRN

## 2016-07-30 NOTE — ED Provider Notes (Signed)
[facility] DEPT Provider Note   CSN: [id number] Arrival date & time: 07/30/16  1407     History   Chief Complaint Chief Complaint  Patient presents with  . Knee Pain    HPI Johnny Shepherd is a 35 y.o. male.  HPI   Johnny Shepherd is a 35 y.o. male who presents to the Emergency Department complaining of left knee pain for three days.  He states he was "down on one knee" stocking a shelf at work, when he stood up he felt a sharp pain to his left knee, thigh and lower back.  He reports persistent pain to the knee since then.  Pain associated with bending.  He denies fall, redness, swelling, fever, or numbness of the extremity.  He has taken ibuprofen with minimal relief.     Past Medical History:  Diagnosis Date  . Cervical radiculopathy    LUE weakness  . Chronic neck and back pain   . Congenital absence of one kidney   . Lumbar radiculopathy     Patient Active Problem List   Diagnosis Date Noted  . Cervical radiculopathy 10/27/2013  . Weakness 10/26/2013  . Weakness of left arm 10/26/2013  . Tobacco abuse 10/26/2013    History reviewed. No pertinent surgical history.     Home Medications    Prior to Admission medications   Medication Sig Start Date End Date Taking? Authorizing Provider  acetaminophen-codeine (TYLENOL #3) 300-30 MG per tablet Take 1-2 tablets by mouth every 6 (six) hours as needed. Patient not taking: Reported on 02/22/2015 11/10/14   Ivery QualeHobson Bryant, PA-C  dexamethasone (DECADRON) 4 MG tablet Take 1 tablet (4 mg total) by mouth 2 (two) times daily with a meal. Patient not taking: Reported on 02/22/2015 11/10/14   Ivery QualeHobson Bryant, PA-C  doxycycline (VIBRAMYCIN) 100 MG capsule Take 1 capsule (100 mg total) by mouth 2 (two) times daily. 12/02/15   Ivery QualeHobson Bryant, PA-C  HYDROcodone-acetaminophen (NORCO/VICODIN) 5-325 MG tablet Take 1 tablet by mouth every 4 (four) hours as needed. 12/02/15   Ivery QualeHobson Bryant, PA-C  ibuprofen (ADVIL,MOTRIN) 600 MG tablet Take 1  tablet (600 mg total) by mouth every 6 (six) hours as needed. 03/07/15   Benjiman CoreNathan Pickering, MD  metaxalone (SKELAXIN) 800 MG tablet Take 1 tablet (800 mg total) by mouth 3 (three) times daily as needed for muscle spasms. 03/07/15   Benjiman CoreNathan Pickering, MD  methocarbamol (ROBAXIN) 500 MG tablet Take 1 tablet (500 mg total) by mouth 3 (three) times daily. Patient not taking: Reported on 02/22/2015 11/10/14   Ivery QualeHobson Bryant, PA-C  ondansetron (ZOFRAN) 4 MG tablet Take 1 tablet (4 mg total) by mouth every 6 (six) hours. Patient not taking: Reported on 02/22/2015 11/10/14   Ivery QualeHobson Bryant, PA-C  silver sulfADIAZINE (SILVADENE) 1 % cream Apply 1 application topically daily. 12/02/15   Ivery QualeHobson Bryant, PA-C  traMADol (ULTRAM) 50 MG tablet Take 1 tablet (50 mg total) by mouth every 6 (six) hours as needed. Patient not taking: Reported on 02/22/2015 01/05/15   Ivery QualeHobson Bryant, PA-C    Family History No family history on file.  Social History Social History  Substance Use Topics  . Smoking status: Current Every Day Smoker    Packs/day: 1.00    Years: 10.00    Types: Cigarettes  . Smokeless tobacco: Never Used  . Alcohol use No     Allergies   Patient has no known allergies.   Review of Systems Review of Systems  Constitutional: Negative for chills and  fever.  Musculoskeletal: Positive for arthralgias (left knee pain). Negative for joint swelling.  Skin: Negative for color change and wound.  Neurological: Negative for dizziness, weakness and numbness.  All other systems reviewed and are negative.    Physical Exam Updated Vital Signs BP 157/55   Pulse 86   Temp 98.2 F (36.8 C)   Resp 16   Wt 115.7 kg   SpO2 97%   BMI 34.58 kg/m   Physical Exam  Constitutional: He is oriented to person, place, and time. He appears well-developed and well-nourished. No distress.  Cardiovascular: Normal rate, regular rhythm and intact distal pulses.   Pulmonary/Chest: Effort normal and breath sounds normal.    Musculoskeletal: He exhibits tenderness. He exhibits no edema or deformity.  Diffuse ttp of the anterior left knee.  No erythema, effusion, or step-off deformity.  Pt has full ROM of the knee. DP pulse brisk, distal sensation intact. Calf is soft and NT.  Neurological: He is alert and oriented to person, place, and time. He exhibits normal muscle tone. Coordination normal.  Skin: Skin is warm and dry. No erythema.  Nursing note and vitals reviewed.    ED Treatments / Results  Labs (all labs ordered are listed, but only abnormal results are displayed) Labs Reviewed - No data to display  EKG  EKG Interpretation None       Radiology Dg Knee Complete 4 Views Left  Result Date: 07/30/2016 CLINICAL DATA:  Left knee pain for 1 week without known injury. EXAM: LEFT KNEE - COMPLETE 4+ VIEW COMPARISON:  Radiographs of July 17, 2013. FINDINGS: No evidence of fracture, dislocation, or joint effusion. No evidence of arthropathy or other focal bone abnormality. Soft tissues are unremarkable. IMPRESSION: Normal left knee. Electronically Signed   By: Lupita RaiderJames  Green Jr, M.D.   On: 07/30/2016 15:41    Procedures Procedures (including critical care time)  Medications Ordered in ED Medications - No data to display   Initial Impression / Assessment and Plan / ED Course  I have reviewed the triage vital signs and the nursing notes.  Pertinent labs & imaging results that were available during my care of the patient were reviewed by me and considered in my medical decision making (see chart for details).  Clinical Course     Pt with left knee tenderness.  No concerning sx's for septic joint.  NV intact.  XR wnml Pt agrees to symptomatic tx and ortho f/u.  Has knee sleeve at home.    Final Clinical Impressions(s) / ED Diagnoses   Final diagnoses:  Sprain of left knee, unspecified ligament, initial encounter    New Prescriptions New Prescriptions   No medications on file     Pauline Ausammy  Deward Sebek, PA-C 07/30/16 1621    Marily MemosJason Mesner, MD 07/30/16 [phone]

## 2016-07-30 NOTE — Discharge Instructions (Signed)
Apply ice packs on/off to your knee.  Wear your knee sleeve as needed.  Call the orthopedic doctor listed to arrange a follow-up appt in one week if not improving

## 2016-07-30 NOTE — ED Triage Notes (Signed)
Pt here for knee pain since Wednesday.  Pt reports that "knee almost gave out" while getting out of a deep bending position and he has had a tingling pain since

## 2016-09-14 ENCOUNTER — Emergency Department (HOSPITAL_COMMUNITY)
Admission: EM | Admit: 2016-09-14 | Discharge: 2016-09-14 | Disposition: A | Discharge status: Home / Self Care | Payer: Self-pay | Attending: Emergency Medicine | Admitting: Emergency Medicine

## 2016-09-14 ENCOUNTER — Encounter (HOSPITAL_COMMUNITY): Payer: Self-pay | Admitting: Emergency Medicine

## 2016-09-14 ENCOUNTER — Emergency Department (HOSPITAL_COMMUNITY): Payer: Self-pay

## 2016-09-14 DIAGNOSIS — B9789 Other viral agents as the cause of diseases classified elsewhere: Secondary | ICD-10-CM

## 2016-09-14 DIAGNOSIS — F17210 Nicotine dependence, cigarettes, uncomplicated: Secondary | ICD-10-CM | POA: Insufficient documentation

## 2016-09-14 DIAGNOSIS — R1010 Upper abdominal pain, unspecified: Secondary | ICD-10-CM | POA: Insufficient documentation

## 2016-09-14 DIAGNOSIS — J069 Acute upper respiratory infection, unspecified: Secondary | ICD-10-CM | POA: Insufficient documentation

## 2016-09-14 DIAGNOSIS — Z79899 Other long term (current) drug therapy: Secondary | ICD-10-CM | POA: Insufficient documentation

## 2016-09-14 LAB — COMPREHENSIVE METABOLIC PANEL
ALK PHOS: 40 U/L (ref 38–126)
ALT: 35 U/L (ref 17–63)
AST: 31 U/L (ref 15–41)
Albumin: 4.2 g/dL (ref 3.5–5.0)
Anion gap: 9 (ref 5–15)
BILIRUBIN TOTAL: 0.6 mg/dL (ref 0.3–1.2)
BUN: 11 mg/dL (ref 6–20)
CO2: 27 mmol/L (ref 22–32)
CREATININE: 0.8 mg/dL (ref 0.61–1.24)
Calcium: 8.8 mg/dL — ABNORMAL LOW (ref 8.9–10.3)
Chloride: 101 mmol/L (ref 101–111)
GFR calc Af Amer: >60 mL/min (ref >60)
Glucose, Bld: 114 mg/dL — ABNORMAL HIGH (ref 65–99)
Potassium: 4.2 mmol/L (ref 3.5–5.1)
Sodium: 137 mmol/L (ref 135–145)
TOTAL PROTEIN: 7.7 g/dL (ref 6.5–8.1)

## 2016-09-14 LAB — CBC WITH DIFFERENTIAL/PLATELET
BASOS ABS: 0 10*3/uL (ref 0.0–0.1)
BASOS PCT: 0 %
Eosinophils Absolute: 0.3 10*3/uL (ref 0.0–0.7)
Eosinophils Relative: 4 %
HCT: 45.7 % (ref 39.0–52.0)
Hemoglobin: 16.7 g/dL (ref 13.0–17.0)
Lymphocytes Relative: 18 %
Lymphs Abs: 1.1 10*3/uL (ref 0.7–4.0)
MCH: 33.9 pg (ref 26.0–34.0)
MCHC: 36.5 g/dL — AB (ref 30.0–36.0)
MCV: 92.7 fL (ref 78.0–100.0)
Monocytes Absolute: 0.6 10*3/uL (ref 0.1–1.0)
Monocytes Relative: 9 %
Neutro Abs: 4.4 10*3/uL (ref 1.7–7.7)
Neutrophils Relative %: 69 %
PLATELETS: 177 10*3/uL (ref 150–400)
RBC: 4.93 MIL/uL (ref 4.22–5.81)
RDW: 12.6 % (ref 11.5–15.5)
WBC: 6.3 10*3/uL (ref 4.0–10.5)

## 2016-09-14 LAB — TROPONIN I

## 2016-09-14 LAB — LIPASE, BLOOD: LIPASE: 22 U/L (ref 11–51)

## 2016-09-14 MED ORDER — ALBUTEROL SULFATE HFA 108 (90 BASE) MCG/ACT IN AERS
2.0000 | INHALATION_SPRAY | Freq: Once | RESPIRATORY_TRACT | Status: AC
  Administered 2016-09-14: 2 via RESPIRATORY_TRACT
  Filled 2016-09-14: qty 6.7

## 2016-09-14 MED ORDER — IPRATROPIUM-ALBUTEROL 0.5-2.5 (3) MG/3ML IN SOLN
3.0000 mL | Freq: Once | RESPIRATORY_TRACT | Status: AC
  Administered 2016-09-14: 3 mL via RESPIRATORY_TRACT
  Filled 2016-09-14: qty 3

## 2016-09-14 MED ORDER — SODIUM CHLORIDE 0.9 % IV BOLUS (SEPSIS)
1000.0000 mL | Freq: Once | INTRAVENOUS | Status: AC
  Administered 2016-09-14: 1000 mL via INTRAVENOUS

## 2016-09-14 MED ORDER — ONDANSETRON 4 MG PO TBDP: 4.0000 mg | ORAL_TABLET | Freq: Three times a day (TID) | ORAL | 0 refills | Status: DC | PRN

## 2016-09-14 MED ORDER — GI COCKTAIL ~~LOC~~
30.0000 mL | Freq: Once | ORAL | Status: AC
  Administered 2016-09-14: 30 mL via ORAL
  Filled 2016-09-14: qty 30

## 2016-09-14 MED ORDER — ONDANSETRON HCL 4 MG/2ML IJ SOLN
4.0000 mg | Freq: Once | INTRAMUSCULAR | Status: AC
  Administered 2016-09-14: 4 mg via INTRAVENOUS
  Filled 2016-09-14: qty 2

## 2016-09-14 NOTE — ED Triage Notes (Signed)
Pt c/o flu like sx x 2 days, states wife was dx with flu. Pt c/o chest pain today. Denies productive cough.

## 2016-09-14 NOTE — ED Provider Notes (Signed)
[facility] Provider Note   CSN: [id number] Arrival date & time: 09/14/16  0745   By signing my name below, I, Johnny Shepherd, attest that this documentation has been prepared under the direction and in the presence of Johnny LovelessScott Aneli Zara, MD. Electronically Signed: Bobbie Stackhristopher Shepherd, Scribe. 09/14/16. 8:34 AM. History   Chief Complaint Chief Complaint  Patient presents with  . Chest Pain    The history is provided by the patient. No language interpreter was used.   HPI Comments: Johnny Shepherd is a 36 y.o. male who presents to the Emergency Department complaining of upper abdominal pain which began around midnight last night. He describes this pain as a burning sensation. He reports some difficulty breathing at this time. He states that he has been sick for the past couple of days. He reports a fever of Tmax 102, chills, vomiting, nausea, and diarrhea. He reports that his nausea and vomiting has resolved. His diarrhea has also resolved. He reports having some nausea this morning but states that he believes it was due to his persistent cough. He has tried taking Theriflu and a Z-pack with no significant relief. He denies any leg pain or leg swelling. He reports no recent travel history. The patient also reports some right sided flank pain and lower back pain. 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 He has a hx of chronic bronchitis. He is a current smoker.   Past Medical History:  Diagnosis Date  . Cervical radiculopathy    LUE weakness  . Chronic neck and back pain   . Congenital absence of one kidney   . Lumbar radiculopathy     Patient Active Problem List   Diagnosis Date Noted  . Cervical radiculopathy 10/27/2013  . Weakness 10/26/2013  . Weakness of left arm 10/26/2013  . Tobacco abuse 10/26/2013    History reviewed. No pertinent surgical history.     Home Medications    Prior to Admission medications   Medication Sig Start Date  End Date Taking? Authorizing Provider  azithromycin (ZITHROMAX) 250 MG tablet Take 250 mg by mouth daily.   Yes Historical Provider, MD  DM-Phenylephrine-Acetaminophen (VICKS DAYQUIL COLD & FLU) 10-5-325 MG/15ML LIQD Take 30 mLs by mouth daily as needed (cold).   Yes Historical Provider, MD  Nutritional Supplements (COLD AND FLU PO) Take 2 capsules by mouth every 4 (four) hours as needed (cold).   Yes Historical Provider, MD  diclofenac (VOLTAREN) 75 MG EC tablet Take 1 tablet (75 mg total) by mouth 2 (two) times daily. Take with food Patient not taking: Reported on 09/14/2016 07/30/16   Tammy Triplett, PA-C  ondansetron (ZOFRAN ODT) 4 MG disintegrating tablet Take 1 tablet (4 mg total) by mouth every 8 (eight) hours as needed for nausea or vomiting. 09/14/16   Johnny LovelessScott Kaliyah Gladman, MD  traMADol (ULTRAM) 50 MG tablet Take 1 tablet (50 mg total) by mouth every 6 (six) hours as needed. Patient not taking: Reported on 09/14/2016 07/30/16   Pauline Ausammy Triplett, PA-C    Family History History reviewed. No pertinent family history.  Social History Social History  Substance Use Topics  . Smoking status: Current Every Day Smoker    Packs/day: 1.00    Years: 10.00    Types: Cigarettes  . Smokeless tobacco: Never Used  . Alcohol use No     Allergies   Patient has no known allergies.   Review of Systems Review of Systems  Eyes: Positive for visual  disturbance.  Respiratory: Positive for shortness of breath.   Cardiovascular: Negative for leg swelling.  Gastrointestinal: Positive for abdominal pain, nausea (Resolved) and vomiting (Resolved).  All other systems reviewed and are negative.    Physical Exam Updated Vital Signs BP 106/85   Pulse 80   Temp 98.4 F (36.9 C)   Resp 13   Ht 6' (1.829 m)   Wt 265 lb (120.2 kg)   SpO2 96%   BMI 35.94 kg/m   Physical Exam  Constitutional: He is oriented to person, place, and time. He appears well-developed and well-nourished.  HENT:  Head:  Normocephalic and atraumatic.  Right Ear: External ear normal.  Left Ear: External ear normal.  Nose: Nose normal.  Mouth/Throat: Oropharynx is clear and moist.  Eyes: Right eye exhibits no discharge. Left eye exhibits no discharge.  Neck: Neck supple.  Cardiovascular: Normal rate, regular rhythm and normal heart sounds.   Pulmonary/Chest: Effort normal and breath sounds normal. He exhibits no tenderness.  decreased breath sound at bases with slight wheeze.  Abdominal: Soft. There is tenderness (mild upper abdominal tenderness.). There is no CVA tenderness.  Musculoskeletal: He exhibits no edema.  Neurological: He is alert and oriented to person, place, and time.  Skin: Skin is warm and dry.  Nursing note and vitals reviewed.    ED Treatments / Results  DIAGNOSTIC STUDIES: Oxygen Saturation is 96% on RA, normal by my interpretation.    COORDINATION OF CARE: 8:25 AM Discussed treatment plan with pt at bedside and pt agreed to plan. I will check his US abdomen, labs, and CXR.  Labs (all labs ordered are listed, but only abnormal results are displayed) Labs Reviewed  COMPREHENSIVE METABOLIC PANEL - Abnormal; Notable for the following:       Result Value   Glucose, Bld 114 (*)    Calcium 8.8 (*)    All other components within normal limits  CBC WITH DIFFERENTIAL/PLATELET - Abnormal; Notable for the following:    MCHC 36.5 (*)    All other components within normal limits  LIPASE, BLOOD  TROPONIN I    EKG  EKG Interpretation  Date/Time:  Wednesday September 14 2016 08:11:01 EST Ventricular Rate:  93 PR Interval:    QRS Duration: 102 QT Interval:  354 QTC Calculation: 441 R Axis:   75 Text Interpretation:  Sinus rhythm Abnormal T, consider ischemia, lateral leads T wave changes new since 2015 Confirmed by Latish Toutant MD, Mac Dowdell [phone]) on 09/14/2016 8:15:39 AM       EKG Interpretation  Date/Time:  Wednesday September 14 2016 08:47:20 EST Ventricular Rate:  84 PR  Interval:    QRS Duration: 101 QT Interval:  370 QTC Calculation: 438 R Axis:   105 Text Interpretation:  Sinus rhythm Left posterior fascicular block T wave inversions gone after limb leads were changed Prior T waves not pathologic otherwise ECG similar to priors Confirmed by Seaver Machia MD, [place] Rosamond [phone]) on 09/14/2016 9:02:54 AM        Radiology Dg Chest 2 View  Result Date: 09/14/2016 CLINICAL DATA:  Two days of flu-like symptoms with onset of chest pain today. Current smoker. EXAM: CHEST  2 VIEW COMPARISON:  Chest x-ray of March 07, 2015 FINDINGS: The lungs are adequately inflated. The interstitial markings are mildly prominent but stable. There is no alveolar infiltrate or pleural effusion. The heart and pulmonary vascularity are normal. The mediastinum is normal in width. The trachea is midline. The bony thorax exhibits no acute abnormality. IMPRESSION: There is  no acute pneumonia. Mild stable interstitial prominence is consistent with the patient's smoking history. Electronically Signed   By: David  [place] M.D.   On: 09/14/2016 08:44   US Abdomen Limited Ruq  Result Date: 09/14/2016 CLINICAL DATA:  Upper abdominal pain for 2 days EXAM: US ABDOMEN LIMITED - RIGHT UPPER QUADRANT COMPARISON:  11/13/2013 FINDINGS: Gallbladder: No gallstones or wall thickening visualized. No sonographic Murphy sign noted by sonographer. Common bile duct: Diameter: 3 mm Liver: Normal echogenicity. No intrahepatic biliary dilatation. Patent portal vein with normal hepatopetal flow. Posterior right hepatic hyperechoic mass measures 2.1 x 2.3 x 1.7 cm. By comparison MRI 11/13/2013, this has previously been demonstrated to be a hemangioma. IMPRESSION: Negative for gallstones or biliary dilatation. No acute finding by ultrasound. 2.3 cm posterior right hepatic echogenic mass compatible with hemangioma. Electronically Signed   By: Judie Petit.  Shick M.D.   On: 09/14/2016 09:29    Procedures Procedures (including critical care  time)  Medications Ordered in ED Medications  sodium chloride 0.9 % bolus 1,000 mL (0 mLs Intravenous Stopped 09/14/16 1042)  ipratropium-albuterol (DUONEB) 0.5-2.5 (3) MG/3ML nebulizer solution 3 mL (3 mLs Nebulization Given 09/14/16 0924)  ondansetron (ZOFRAN) injection 4 mg (4 mg Intravenous Given 09/14/16 0849)  gi cocktail (Maalox,Lidocaine,Donnatal) (30 mLs Oral Given 09/14/16 0849)  albuterol (PROVENTIL HFA;VENTOLIN HFA) 108 (90 Base) MCG/ACT inhaler 2 puff (2 puffs Inhalation Given 09/14/16 1042)     Initial Impression / Assessment and Plan / ED Course  I have reviewed the triage vital signs and the nursing notes.  Pertinent labs & imaging results that were available during my care of the patient were reviewed by me and considered in my medical decision making (see chart for details).  Clinical Course as of Sep 14 1518  Wed Sep 14, 2016  0840 Fluids, labs, CXR, RUQ u/s. Most likely this is flu-like vs other viral illness now with pain, possibly from coughing or GI upset. I think ACS, PE and dissection are much less likely  [SG]  0909 ECG was repeated with limb leads changed and now ECG appears benign and similar to multiple others. Thus I think ACS is even less likely and that initial abnormal ECG was due to lead placement  [SG]    Clinical Course User Index [SG] Johnny Loveless, MD    Patient appears to have a viral infection/flu-like illness. Feels much better after duoneb. Lungs improved. No distress at any time. Abd pain likely from coughing and/or GI upset from illness. RUQ u/s benign. Initial ECG abnormal due to limb lead placement. I doubt ACS, PE or abd emergency. Given albuterol inhaler for home use. Counseled on smoking cessation. Discussed return precautions.  Final Clinical Impressions(s) / ED Diagnoses   Final diagnoses:  Upper abdominal pain  Viral upper respiratory tract infection with cough    New Prescriptions Discharge Medication List as of 09/14/2016 10:18 AM     START taking these medications   Details  ondansetron (ZOFRAN ODT) 4 MG disintegrating tablet Take 1 tablet (4 mg total) by mouth every 8 (eight) hours as needed for nausea or vomiting., Starting Wed 09/14/2016, Print       I personally performed the services described in this documentation, which was scribed in my presence. The recorded information has been reviewed and is accurate.    Johnny Loveless, MD 09/14/16 [phone]

## 2017-02-14 IMAGING — DX DG CHEST 2V
2 series · 2 of 2 positions shown · non-contrast
Comparison: Chest x-ray of March 07, 2015

CLINICAL DATA: Two days of flu-like symptoms with onset of chest
pain today. Current smoker.

EXAM:
CHEST  2 VIEW

[chest pa]
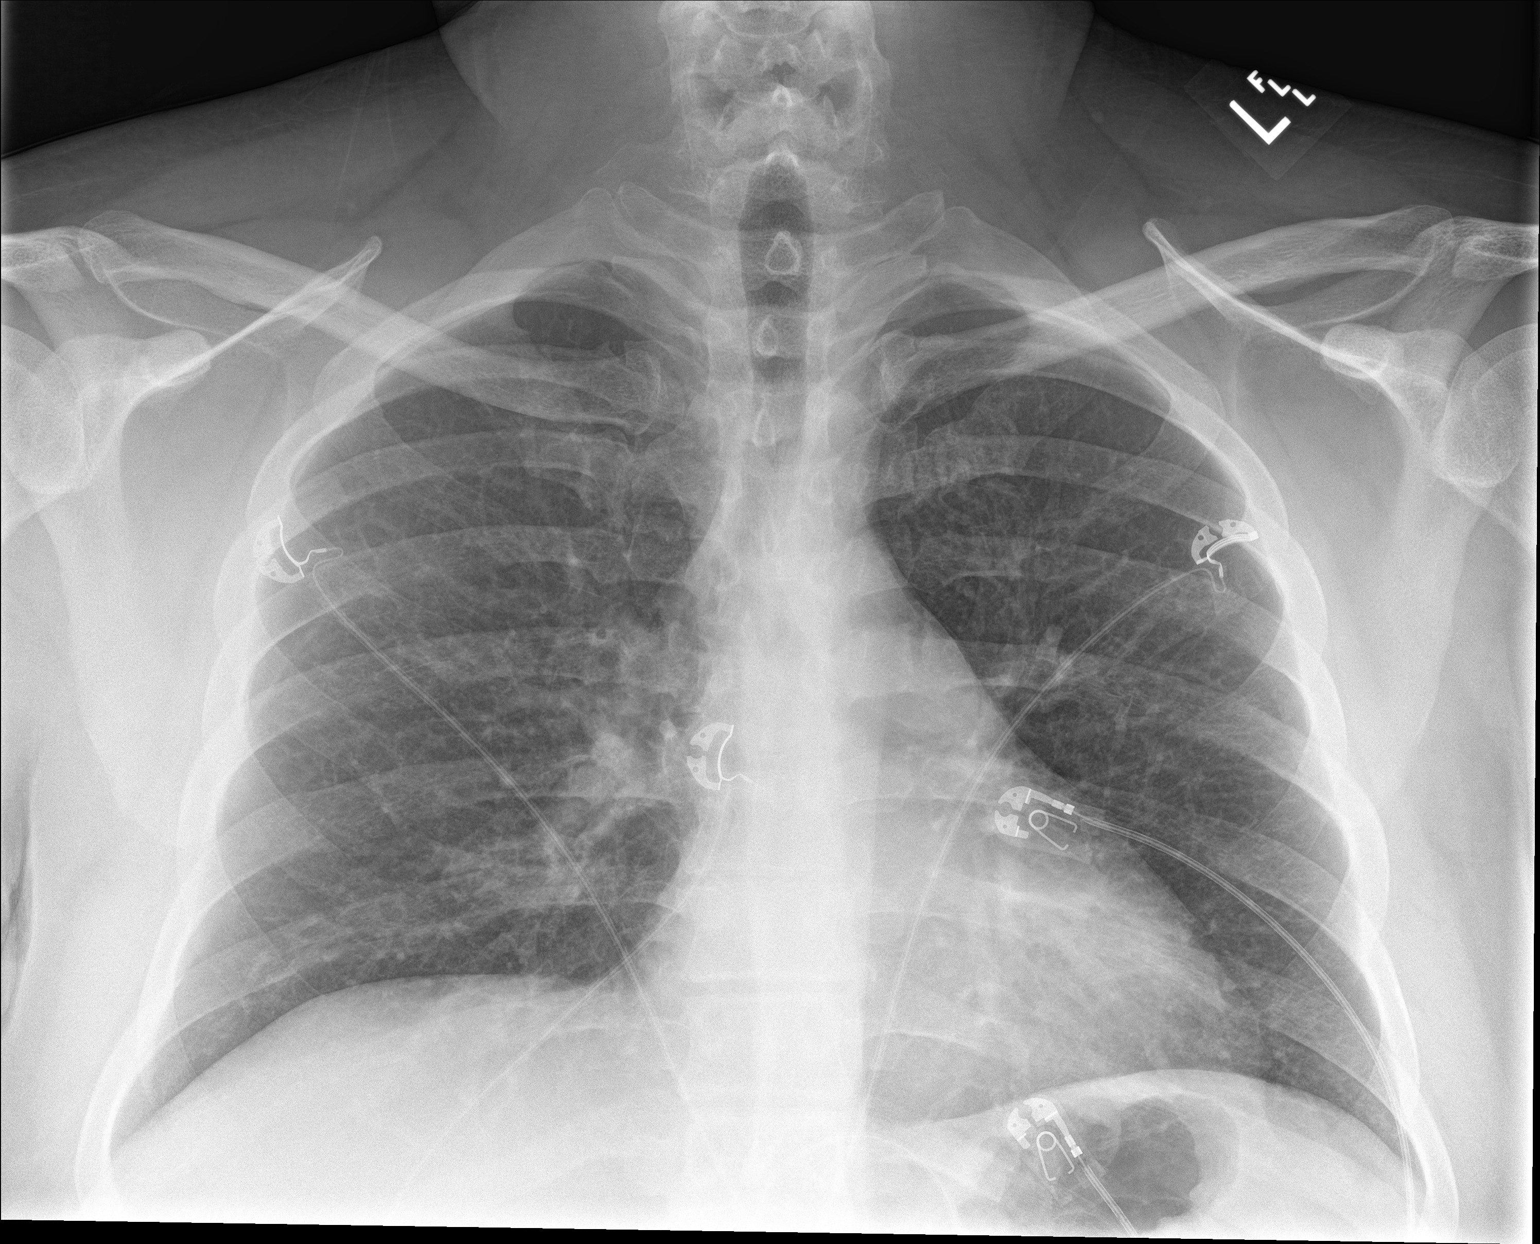

[chest lat]
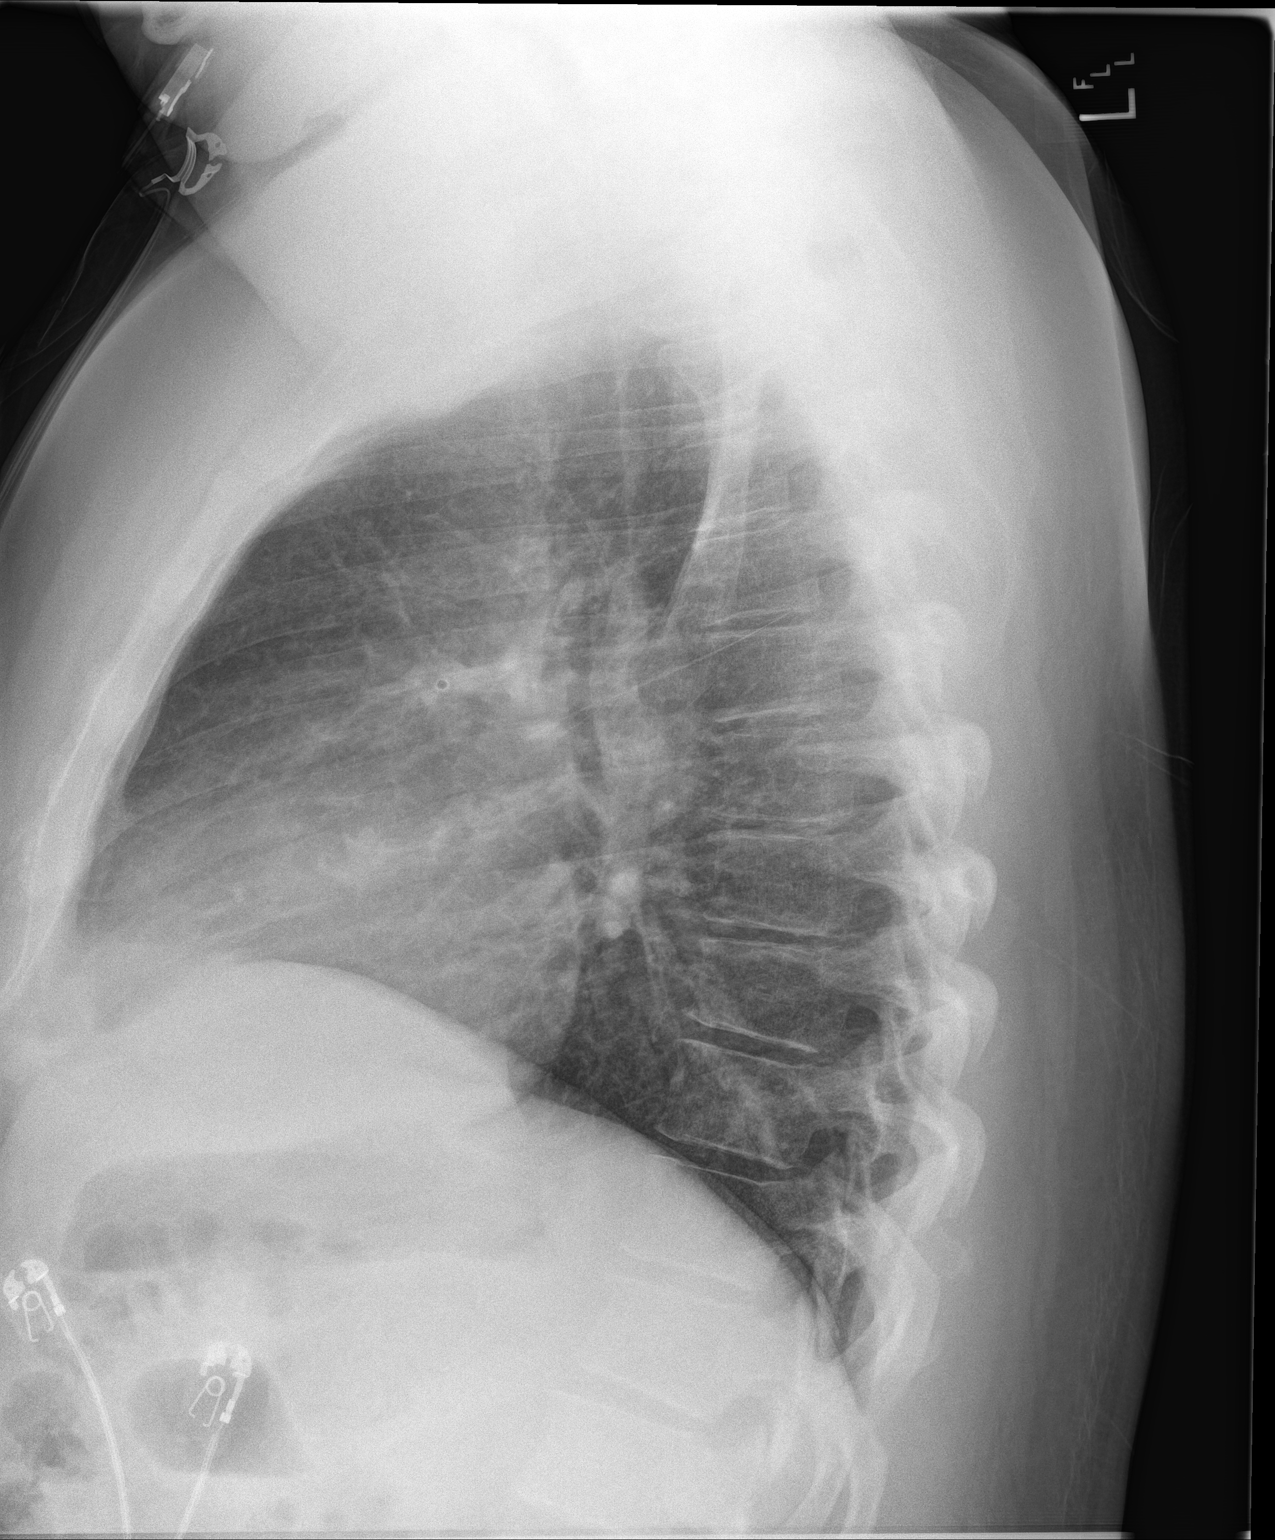

[2 of 2 positions shown; findings below may reference images not displayed]

FINDINGS: The lungs are adequately inflated. The interstitial markings are
mildly prominent but stable. There is no alveolar infiltrate or
pleural effusion. The heart and pulmonary vascularity are normal.
The mediastinum is normal in width. The trachea is midline. The bony
thorax exhibits no acute abnormality.
IMPRESSION: There is no acute pneumonia. Mild stable interstitial prominence is
consistent with the patient's smoking history.

## 2017-06-23 ENCOUNTER — Other Ambulatory Visit: Payer: Self-pay

## 2017-06-23 ENCOUNTER — Encounter (HOSPITAL_COMMUNITY): Payer: Self-pay | Admitting: *Deleted

## 2017-06-23 ENCOUNTER — Emergency Department (HOSPITAL_COMMUNITY)
Admission: EM | Admit: 2017-06-23 | Discharge: 2017-06-23 | Disposition: A | Discharge status: Home / Self Care | Payer: Medicaid Other | Attending: Emergency Medicine | Admitting: Emergency Medicine

## 2017-06-23 DIAGNOSIS — Y939 Activity, unspecified: Secondary | ICD-10-CM | POA: Insufficient documentation

## 2017-06-23 DIAGNOSIS — F17210 Nicotine dependence, cigarettes, uncomplicated: Secondary | ICD-10-CM | POA: Diagnosis not present

## 2017-06-23 DIAGNOSIS — W260XXA Contact with knife, initial encounter: Secondary | ICD-10-CM | POA: Diagnosis not present

## 2017-06-23 DIAGNOSIS — S61412A Laceration without foreign body of left hand, initial encounter: Secondary | ICD-10-CM | POA: Diagnosis present

## 2017-06-23 DIAGNOSIS — Y990 Civilian activity done for income or pay: Secondary | ICD-10-CM | POA: Insufficient documentation

## 2017-06-23 DIAGNOSIS — Y929 Unspecified place or not applicable: Secondary | ICD-10-CM | POA: Insufficient documentation

## 2017-06-23 DIAGNOSIS — Z79899 Other long term (current) drug therapy: Secondary | ICD-10-CM | POA: Diagnosis not present

## 2017-06-23 MED ORDER — POVIDONE-IODINE 10 % EX SOLN
CUTANEOUS | Status: DC | PRN
  Administered 2017-06-23: 2 via TOPICAL
  Filled 2017-06-23: qty 30

## 2017-06-23 MED ORDER — LIDOCAINE HCL (PF) 2 % IJ SOLN
5.0000 mL | Freq: Once | INTRAMUSCULAR | Status: AC
  Administered 2017-06-23: 5 mL via INTRADERMAL

## 2017-06-23 MED ORDER — LIDOCAINE HCL (PF) 2 % IJ SOLN
INTRAMUSCULAR | Status: AC
  Filled 2017-06-23: qty 10

## 2017-06-23 NOTE — ED Notes (Signed)
TT in to assess 

## 2017-06-23 NOTE — ED Notes (Addendum)
Pt reports he works at [employer] and was using a Geneticist, molecularbox cutter  Cut his thumb and index finger   thumb lac is 1/2 in with bleeding controlled  Index finger is slightly bigger with bleeding controlled  Edges well approximated

## 2017-06-23 NOTE — Discharge Instructions (Signed)
Keep the wound clean with mild soap and water.  Keep it bandaged.  Sutures out in 8-10 days.  Return here for any signs of infection.

## 2017-06-23 NOTE — ED Triage Notes (Signed)
Pt states he was at work and cut his left forefinger and thumb on box cutter; bleeding controlled at this time

## 2017-06-23 NOTE — ED Provider Notes (Signed)
[facility] EMERGENCY DEPARTMENT Provider Note   CSN: [id number] Arrival date & time: 06/23/17  1853     History   Chief Complaint Chief Complaint  Patient presents with  . Laceration    HPI Johnny Shepherd is a 36 y.o. male.  HPI  Johnny Shepherd is a 36 y.o. male who presents to the Emergency Department complaining of laceration of his left hand between thumb and index finger.  He states this is a workers comp injury.  He states he was using a box cutter when it accidentally slipped cutting his hand.  Bleeding was controlled with direct pressure.  He complains of mild pain with thumb movement.  He denies any swelling, use of blood thinners, numbness or tingling to his hand or wrist.  Last Td was earlier this year.   Past Medical History:  Diagnosis Date  . Cervical radiculopathy    LUE weakness  . Chronic neck and back pain   . Congenital absence of one kidney   . Lumbar radiculopathy     Patient Active Problem List   Diagnosis Date Noted  . Cervical radiculopathy 10/27/2013  . Weakness 10/26/2013  . Weakness of left arm 10/26/2013  . Tobacco abuse 10/26/2013    History reviewed. No pertinent surgical history.     Home Medications    Prior to Admission medications   Medication Sig Start Date End Date Taking? Authorizing Provider  QUEtiapine (SEROQUEL) 25 MG tablet Take 25 mg by mouth at bedtime.   Yes [provider]  azithromycin (ZITHROMAX) 250 MG tablet Take 250 mg by mouth daily.    [provider]  diclofenac (VOLTAREN) 75 MG EC tablet Take 1 tablet (75 mg total) by mouth 2 (two) times daily. Take with food Patient not taking: Reported on 09/14/2016 07/30/16   Perrie Ragin, PA-C  DM-Phenylephrine-Acetaminophen (VICKS DAYQUIL COLD & FLU) 10-5-325 MG/15ML LIQD Take 30 mLs by mouth daily as needed (cold).    [provider]  Nutritional Supplements (COLD AND FLU PO) Take 2 capsules by mouth every 4 (four) hours as needed (cold).     [provider]  ondansetron (ZOFRAN ODT) 4 MG disintegrating tablet Take 1 tablet (4 mg total) by mouth every 8 (eight) hours as needed for nausea or vomiting. 09/14/16   Pricilla LovelessGoldston, Scott, MD  traMADol (ULTRAM) 50 MG tablet Take 1 tablet (50 mg total) by mouth every 6 (six) hours as needed. Patient not taking: Reported on 09/14/2016 07/30/16   Pauline Ausriplett, Shalik Sanfilippo, PA-C    Family History History reviewed. No pertinent family history.  Social History Social History   Tobacco Use  . Smoking status: Current Every Day Smoker    Packs/day: 1.00    Years: 10.00    Pack years: 10.00    Types: Cigarettes  . Smokeless tobacco: Never Used  Substance Use Topics  . Alcohol use: No  . Drug use: No     Allergies   Patient has no known allergies.   Review of Systems Review of Systems  Constitutional: Negative for chills and fever.  Musculoskeletal: Negative for arthralgias, back pain and joint swelling.  Skin: Positive for wound.       Laceration left hand  Neurological: Negative for dizziness, weakness and numbness.  Hematological: Does not bruise/bleed easily.  All other systems reviewed and are negative.    Physical Exam Updated Vital Signs BP 140/88 (BP Location: Right Arm)   Pulse 71   Temp 98.6 F (37 C) (Oral)  Resp 18   Ht 6' (1.829 m)   Wt 120.2 kg (265 lb)   SpO2 99%   BMI 35.94 kg/m   Physical Exam  Constitutional: He is oriented to person, place, and time. He appears well-developed and well-nourished. No distress.  HENT:  Head: Normocephalic and atraumatic.  Cardiovascular: Normal rate, regular rhythm and intact distal pulses.  No murmur heard. Pulmonary/Chest: Effort normal and breath sounds normal. No respiratory distress.  Musculoskeletal: Normal range of motion. He exhibits no edema or tenderness.       Left hand: He exhibits laceration. He exhibits no swelling. Normal sensation noted. Normal strength noted. He exhibits no finger abduction and no  thumb/finger opposition.       Hands: Patient has full range of motion of the left thumb and index finger.  Neurological: He is alert and oriented to person, place, and time. No sensory deficit. He exhibits normal muscle tone. Coordination normal.  Skin: Skin is warm. Capillary refill takes less than 2 seconds. Laceration noted.  2 cm Laceration to the first webspace of the left hand.  No edema.  Bleeding control.   Nursing note and vitals reviewed.    ED Treatments / Results  Labs (all labs ordered are listed, but only abnormal results are displayed) Labs Reviewed - No data to display  EKG  EKG Interpretation None       Radiology No results found.  Procedures Procedures (including critical care time)  LACERATION REPAIR Performed by: Adeoluwa Silvers L. Authorized by: Maxwell CaulRIPLETT,Irmalee Riemenschneider L. Consent: Verbal consent obtained. Risks and benefits: risks, benefits and alternatives were discussed Consent given by: patient Patient identity confirmed: provided demographic data Prepped and Draped in normal sterile fashion Wound explored  Laceration Location: left hand  Laceration Length: 2 cm  No Foreign Bodies seen or palpated  Anesthesia: local infiltration  Local anesthetic: lidocaine 2 % w/o epinephrine  Anesthetic total: 2 ml  Irrigation method: syringe Amount of cleaning: standard  Skin closure: 4-0 ethilon  Number of sutures: 3  Technique: simple interrupted  Patient tolerance: Patient tolerated the procedure well with no immediate complications.  Medications Ordered in ED Medications  povidone-iodine (BETADINE) 10 % external solution (2 application Topical Given 06/23/17 2130)  lidocaine (XYLOCAINE) 2 % injection 5 mL (5 mLs Intradermal Given 06/23/17 2129)     Initial Impression / Assessment and Plan / ED Course  I have reviewed the triage vital signs and the nursing notes.  Pertinent labs & imaging results that were available during my care of the  patient were reviewed by me and considered in my medical decision making (see chart for details).     Wound care instructions discussed.  Sutures out in 8-10 days.  Return precautions discussed.   Final Clinical Impressions(s) / ED Diagnoses   Final diagnoses:  Laceration of left hand without foreign body, initial encounter    ED Discharge Orders    None       Rosey Bathriplett, Kimorah Ridolfi, PA-C 06/23/17 2213    Jacalyn LefevreHaviland, Julie, MD 06/23/17 2325

## 2018-04-18 ENCOUNTER — Encounter (HOSPITAL_COMMUNITY): Payer: Self-pay | Admitting: Emergency Medicine

## 2018-04-18 ENCOUNTER — Other Ambulatory Visit: Payer: Self-pay

## 2018-04-18 ENCOUNTER — Emergency Department (HOSPITAL_COMMUNITY)
Admission: EM | Admit: 2018-04-18 | Discharge: 2018-04-18 | Disposition: A | Discharge status: Home / Self Care | Payer: Medicaid Other | Attending: Emergency Medicine | Admitting: Emergency Medicine

## 2018-04-18 ENCOUNTER — Emergency Department (HOSPITAL_COMMUNITY): Payer: Medicaid Other

## 2018-04-18 DIAGNOSIS — S93402A Sprain of unspecified ligament of left ankle, initial encounter: Secondary | ICD-10-CM

## 2018-04-18 DIAGNOSIS — S9782XA Crushing injury of left foot, initial encounter: Secondary | ICD-10-CM | POA: Diagnosis present

## 2018-04-18 DIAGNOSIS — Z79899 Other long term (current) drug therapy: Secondary | ICD-10-CM | POA: Insufficient documentation

## 2018-04-18 DIAGNOSIS — Y92007 Garden or yard of unspecified non-institutional (private) residence as the place of occurrence of the external cause: Secondary | ICD-10-CM | POA: Insufficient documentation

## 2018-04-18 DIAGNOSIS — S9032XA Contusion of left foot, initial encounter: Secondary | ICD-10-CM | POA: Diagnosis not present

## 2018-04-18 DIAGNOSIS — F17210 Nicotine dependence, cigarettes, uncomplicated: Secondary | ICD-10-CM | POA: Diagnosis not present

## 2018-04-18 DIAGNOSIS — Y9389 Activity, other specified: Secondary | ICD-10-CM | POA: Insufficient documentation

## 2018-04-18 DIAGNOSIS — W230XXA Caught, crushed, jammed, or pinched between moving objects, initial encounter: Secondary | ICD-10-CM | POA: Diagnosis not present

## 2018-04-18 DIAGNOSIS — Y999 Unspecified external cause status: Secondary | ICD-10-CM | POA: Insufficient documentation

## 2018-04-18 MED ORDER — HYDROCODONE-ACETAMINOPHEN 5-325 MG PO TABS: 1.0000 | ORAL_TABLET | ORAL | 0 refills | Status: DC | PRN

## 2018-04-18 MED ORDER — IBUPROFEN 600 MG PO TABS: 600.0000 mg | ORAL_TABLET | Freq: Four times a day (QID) | ORAL | 0 refills | Status: DC

## 2018-04-18 MED ORDER — ONDANSETRON HCL 4 MG PO TABS
4.0000 mg | ORAL_TABLET | Freq: Once | ORAL | Status: AC
  Administered 2018-04-18: 4 mg via ORAL
  Filled 2018-04-18: qty 1

## 2018-04-18 MED ORDER — HYDROCODONE-ACETAMINOPHEN 5-325 MG PO TABS
2.0000 | ORAL_TABLET | Freq: Once | ORAL | Status: AC
  Administered 2018-04-18: 2 via ORAL
  Filled 2018-04-18: qty 2

## 2018-04-18 MED ORDER — IBUPROFEN 800 MG PO TABS
800.0000 mg | ORAL_TABLET | Freq: Once | ORAL | Status: AC
  Administered 2018-04-18: 800 mg via ORAL
  Filled 2018-04-18: qty 1

## 2018-04-18 NOTE — ED Notes (Signed)
Pt called out for heat pack. Pt provided an ice pack and education regarding pain management techniques post extremity injury.

## 2018-04-18 NOTE — Discharge Instructions (Addendum)
Your x-ray is negative for fracture or dislocation.  Your examination favors a contusion to the left foot, and sprain of the left ankle.  Please keep your foot ankle elevated above your waist when you are sitting and above your heart when you are lying down.  Use your crutches until you can safely apply weight to the area.  Use ibuprofen 600 mg with breakfast, lunch, dinner, and at bedtime.  May use Norco every 4 hours if needed for severe pain.This medication may cause drowsiness. Please do not drink, drive, or participate in activity that requires concentration while taking this medication.  Please see Dr. Romeo AppleHarrison for orthopedic evaluation if not improving with his current regiment.
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
## 2018-10-05 ENCOUNTER — Encounter (HOSPITAL_COMMUNITY): Payer: Self-pay | Admitting: Emergency Medicine

## 2018-10-05 ENCOUNTER — Other Ambulatory Visit: Payer: Self-pay

## 2018-10-05 ENCOUNTER — Emergency Department (HOSPITAL_COMMUNITY)
Admission: EM | Admit: 2018-10-05 | Discharge: 2018-10-05 | Disposition: A | Discharge status: Home / Self Care | Payer: Medicaid Other | Attending: Emergency Medicine | Admitting: Emergency Medicine

## 2018-10-05 DIAGNOSIS — F17210 Nicotine dependence, cigarettes, uncomplicated: Secondary | ICD-10-CM | POA: Insufficient documentation

## 2018-10-05 DIAGNOSIS — Y990 Civilian activity done for income or pay: Secondary | ICD-10-CM | POA: Insufficient documentation

## 2018-10-05 DIAGNOSIS — T3110 Burns involving 10-19% of body surface with 0% to 9% third degree burns: Secondary | ICD-10-CM

## 2018-10-05 DIAGNOSIS — T22032A Burn of unspecified degree of left upper arm, initial encounter: Secondary | ICD-10-CM | POA: Diagnosis present

## 2018-10-05 DIAGNOSIS — Y92511 Restaurant or cafe as the place of occurrence of the external cause: Secondary | ICD-10-CM | POA: Insufficient documentation

## 2018-10-05 DIAGNOSIS — Y9389 Activity, other specified: Secondary | ICD-10-CM | POA: Insufficient documentation

## 2018-10-05 DIAGNOSIS — X102XXA Contact with fats and cooking oils, initial encounter: Secondary | ICD-10-CM | POA: Diagnosis not present

## 2018-10-05 MED ORDER — SILVER SULFADIAZINE 1 % EX CREA
TOPICAL_CREAM | Freq: Once | CUTANEOUS | Status: AC
  Administered 2018-10-05: 21:00:00 via TOPICAL
  Filled 2018-10-05: qty 50

## 2018-10-05 MED ORDER — HYDROCODONE-ACETAMINOPHEN 5-325 MG PO TABS: 2.0000 | ORAL_TABLET | ORAL | 0 refills | Status: DC | PRN

## 2018-10-05 MED ORDER — HYDROCODONE-ACETAMINOPHEN 5-325 MG PO TABS
1.0000 | ORAL_TABLET | Freq: Once | ORAL | Status: AC
Start: 2018-10-05 — End: 2018-10-05
  Administered 2018-10-05: 1 via ORAL
  Filled 2018-10-05: qty 1

## 2018-10-05 MED ORDER — NAPROXEN 500 MG PO TABS: 500.0000 mg | ORAL_TABLET | Freq: Two times a day (BID) | ORAL | 0 refills | Status: DC

## 2018-10-05 MED ORDER — KETOROLAC TROMETHAMINE 30 MG/ML IJ SOLN
30.0000 mg | Freq: Once | INTRAMUSCULAR | Status: AC
  Administered 2018-10-05: 30 mg via INTRAMUSCULAR
  Filled 2018-10-05: qty 1

## 2018-10-05 NOTE — ED Notes (Signed)
Patient given prepack

## 2018-10-05 NOTE — ED Notes (Signed)
edp to room  
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
## 2018-10-05 NOTE — Discharge Instructions (Addendum)
Try to keep the few blisters intact as long as possible.  Take hydrocodone as needed for pain take the Naprosyn along with it.  Burns mostly first-degree a few second-degree burns.  No evidence of any third-degree or full-thickness burns.  If any of the blisters open up then apply the Silvadene.  Follow-up in the emergency department fast track on Sunday for reevaluation of the burns.  Work note provided.

## 2018-10-08 ENCOUNTER — Other Ambulatory Visit: Payer: Self-pay

## 2018-10-08 ENCOUNTER — Emergency Department (HOSPITAL_COMMUNITY)
Admission: EM | Admit: 2018-10-08 | Discharge: 2018-10-08 | Disposition: A | Discharge status: Home / Self Care | Payer: Medicaid Other | Attending: Emergency Medicine | Admitting: Emergency Medicine

## 2018-10-08 ENCOUNTER — Encounter (HOSPITAL_COMMUNITY): Payer: Self-pay | Admitting: Emergency Medicine

## 2018-10-08 DIAGNOSIS — X102XXA Contact with fats and cooking oils, initial encounter: Secondary | ICD-10-CM | POA: Insufficient documentation

## 2018-10-08 DIAGNOSIS — T22212D Burn of second degree of left forearm, subsequent encounter: Secondary | ICD-10-CM | POA: Diagnosis not present

## 2018-10-08 DIAGNOSIS — T23162D Burn of first degree of back of left hand, subsequent encounter: Secondary | ICD-10-CM | POA: Diagnosis not present

## 2018-10-08 DIAGNOSIS — T2003XD Burn of unspecified degree of chin, subsequent encounter: Secondary | ICD-10-CM | POA: Insufficient documentation

## 2018-10-08 DIAGNOSIS — M79632 Pain in left forearm: Secondary | ICD-10-CM | POA: Diagnosis present

## 2018-10-08 DIAGNOSIS — F17210 Nicotine dependence, cigarettes, uncomplicated: Secondary | ICD-10-CM | POA: Insufficient documentation

## 2018-10-08 MED ORDER — HYDROCODONE-ACETAMINOPHEN 7.5-325 MG PO TABS: 1.0000 | ORAL_TABLET | Freq: Four times a day (QID) | ORAL | 0 refills | Status: DC | PRN

## 2018-10-08 MED ORDER — SILVER SULFADIAZINE 1 % EX CREA
TOPICAL_CREAM | Freq: Once | CUTANEOUS | Status: AC
  Administered 2018-10-08: 19:00:00 via TOPICAL
  Filled 2018-10-08: qty 50

## 2018-10-08 MED ORDER — SILVER SULFADIAZINE 1 % EX CREA: 1.0000 "application " | TOPICAL_CREAM | Freq: Two times a day (BID) | CUTANEOUS | 0 refills | Status: DC
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
## 2018-10-08 NOTE — Discharge Instructions (Addendum)
Wash off and reapply the Silvadene cream twice a day.  Someone from the physical therapy department will contact you to arrange follow-up.  Their number is [phone].  You also need to follow-up with your primary care provider for recheck.
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
## 2018-10-10 ENCOUNTER — Ambulatory Visit (HOSPITAL_COMMUNITY): Payer: Medicaid Other | Admitting: Physical Therapy

## 2018-10-10 MED FILL — Hydrocodone-Acetaminophen Tab 5-325 MG: ORAL | Qty: 6 | Status: AC
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
## 2018-10-23 ENCOUNTER — Emergency Department (HOSPITAL_COMMUNITY)
Admission: EM | Admit: 2018-10-23 | Discharge: 2018-10-24 | Disposition: A | Discharge status: Other Acute Inpatient Hospital | Payer: Medicaid Other | Attending: Emergency Medicine | Admitting: Emergency Medicine

## 2018-10-23 ENCOUNTER — Other Ambulatory Visit: Payer: Self-pay

## 2018-10-23 ENCOUNTER — Encounter (HOSPITAL_COMMUNITY): Payer: Self-pay | Admitting: Emergency Medicine

## 2018-10-23 DIAGNOSIS — T2601XA Burn of right eyelid and periocular area, initial encounter: Secondary | ICD-10-CM | POA: Diagnosis not present

## 2018-10-23 DIAGNOSIS — Y92010 Kitchen of single-family (private) house as the place of occurrence of the external cause: Secondary | ICD-10-CM | POA: Diagnosis not present

## 2018-10-23 DIAGNOSIS — T22241A Burn of second degree of right axilla, initial encounter: Secondary | ICD-10-CM | POA: Diagnosis not present

## 2018-10-23 DIAGNOSIS — T25122A Burn of first degree of left foot, initial encounter: Secondary | ICD-10-CM | POA: Insufficient documentation

## 2018-10-23 DIAGNOSIS — T2122XA Burn of second degree of abdominal wall, initial encounter: Secondary | ICD-10-CM | POA: Diagnosis not present

## 2018-10-23 DIAGNOSIS — S4991XA Unspecified injury of right shoulder and upper arm, initial encounter: Secondary | ICD-10-CM | POA: Diagnosis present

## 2018-10-23 DIAGNOSIS — T300 Burn of unspecified body region, unspecified degree: Secondary | ICD-10-CM

## 2018-10-23 DIAGNOSIS — X102XXA Contact with fats and cooking oils, initial encounter: Secondary | ICD-10-CM | POA: Diagnosis not present

## 2018-10-23 DIAGNOSIS — T23372A Burn of third degree of left wrist, initial encounter: Secondary | ICD-10-CM | POA: Diagnosis not present

## 2018-10-23 DIAGNOSIS — T2022XA Burn of second degree of lip(s), initial encounter: Secondary | ICD-10-CM | POA: Insufficient documentation

## 2018-10-23 DIAGNOSIS — Y998 Other external cause status: Secondary | ICD-10-CM | POA: Diagnosis not present

## 2018-10-23 DIAGNOSIS — T20211A Burn of second degree of right ear [any part, except ear drum], initial encounter: Secondary | ICD-10-CM | POA: Insufficient documentation

## 2018-10-23 DIAGNOSIS — Y93G3 Activity, cooking and baking: Secondary | ICD-10-CM | POA: Diagnosis not present

## 2018-10-23 DIAGNOSIS — T2123XA Burn of second degree of upper back, initial encounter: Secondary | ICD-10-CM | POA: Insufficient documentation

## 2018-10-23 DIAGNOSIS — F17210 Nicotine dependence, cigarettes, uncomplicated: Secondary | ICD-10-CM | POA: Diagnosis not present

## 2018-10-23 DIAGNOSIS — T2026XA Burn of second degree of forehead and cheek, initial encounter: Secondary | ICD-10-CM | POA: Diagnosis not present

## 2018-10-23 DIAGNOSIS — T2121XA Burn of second degree of chest wall, initial encounter: Secondary | ICD-10-CM | POA: Diagnosis not present

## 2018-10-23 DIAGNOSIS — T25221A Burn of second degree of right foot, initial encounter: Secondary | ICD-10-CM | POA: Insufficient documentation

## 2018-10-23 DIAGNOSIS — T23371A Burn of third degree of right wrist, initial encounter: Secondary | ICD-10-CM | POA: Insufficient documentation

## 2018-10-23 MED ORDER — HYDROMORPHONE HCL 1 MG/ML IJ SOLN
1.0000 mg | Freq: Once | INTRAMUSCULAR | Status: AC
  Administered 2018-10-24: 1 mg via INTRAVENOUS

## 2018-10-23 MED ORDER — PROMETHAZINE HCL 25 MG/ML IJ SOLN
INTRAMUSCULAR | Status: AC
  Administered 2018-10-24: 12.5 mg via INTRAVENOUS
  Filled 2018-10-23: qty 1

## 2018-10-23 MED ORDER — HYDROMORPHONE HCL 1 MG/ML IJ SOLN
INTRAMUSCULAR | Status: AC
  Administered 2018-10-24: 1 mg via INTRAVENOUS
  Filled 2018-10-23: qty 1

## 2018-10-23 MED ORDER — PROMETHAZINE HCL 25 MG/ML IJ SOLN
12.5000 mg | Freq: Once | INTRAMUSCULAR | Status: AC
  Administered 2018-10-24: 12.5 mg via INTRAVENOUS
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
## 2018-10-24 ENCOUNTER — Encounter (HOSPITAL_BASED_OUTPATIENT_CLINIC_OR_DEPARTMENT_OTHER): Payer: Medicaid Other | Attending: Internal Medicine

## 2018-10-24 ENCOUNTER — Emergency Department (HOSPITAL_COMMUNITY): Payer: Medicaid Other

## 2018-10-24 LAB — CBC WITH DIFFERENTIAL/PLATELET
Abs Immature Granulocytes: 0.06 K/uL (ref 0.00–0.07)
Basophils Absolute: 0.2 K/uL — ABNORMAL HIGH (ref 0.0–0.1)
Basophils Relative: 1 %
Eosinophils Absolute: 0.6 K/uL — ABNORMAL HIGH (ref 0.0–0.5)
Eosinophils Relative: 4 %
HCT: 52.6 % — ABNORMAL HIGH (ref 39.0–52.0)
Hemoglobin: 18.6 g/dL — ABNORMAL HIGH (ref 13.0–17.0)
Immature Granulocytes: 0 %
Lymphocytes Relative: 35 %
Lymphs Abs: 5.8 K/uL — ABNORMAL HIGH (ref 0.7–4.0)
MCH: 33.6 pg (ref 26.0–34.0)
MCHC: 35.4 g/dL (ref 30.0–36.0)
MCV: 94.9 fL (ref 80.0–100.0)
Monocytes Absolute: 1.2 K/uL — ABNORMAL HIGH (ref 0.1–1.0)
Monocytes Relative: 7 %
Neutro Abs: 8.7 K/uL — ABNORMAL HIGH (ref 1.7–7.7)
Neutrophils Relative %: 53 %
Platelets: 368 K/uL (ref 150–400)
RBC: 5.54 MIL/uL (ref 4.22–5.81)
RDW: 12.4 % (ref 11.5–15.5)
WBC: 16.5 K/uL — ABNORMAL HIGH (ref 4.0–10.5)
nRBC: 0 % (ref 0.0–0.2)

## 2018-10-24 LAB — BASIC METABOLIC PANEL WITH GFR
Anion gap: 15 (ref 5–15)
BUN: 11 mg/dL (ref 6–20)
CO2: 19 mmol/L — ABNORMAL LOW (ref 22–32)
Calcium: 9.2 mg/dL (ref 8.9–10.3)
Chloride: 103 mmol/L (ref 98–111)
Creatinine, Ser: 0.97 mg/dL (ref 0.61–1.24)
GFR calc Af Amer: >60 mL/min
GFR calc non Af Amer: >60 mL/min
Glucose, Bld: 147 mg/dL — ABNORMAL HIGH (ref 70–99)
Potassium: 3 mmol/L — ABNORMAL LOW (ref 3.5–5.1)
Sodium: 137 mmol/L (ref 135–145)

## 2018-10-24 LAB — CK: Total CK: 87 U/L (ref 49–397)

## 2018-10-24 MED ORDER — HYDROMORPHONE HCL 1 MG/ML IJ SOLN
1.0000 mg | INTRAMUSCULAR | Status: AC
  Administered 2018-10-24: 1 mg via INTRAVENOUS

## 2018-10-24 MED ORDER — POTASSIUM CHLORIDE CRYS ER 20 MEQ PO TBCR: 40.0000 meq | EXTENDED_RELEASE_TABLET | Freq: Once | ORAL | Status: DC

## 2018-10-24 MED ORDER — HYDROMORPHONE HCL 1 MG/ML IJ SOLN
1.0000 mg | Freq: Once | INTRAMUSCULAR | Status: AC
  Administered 2018-10-24: 1 mg via INTRAVENOUS

## 2018-10-24 MED ORDER — SODIUM CHLORIDE 0.9 % IV SOLN: 1000.0000 mL | INTRAVENOUS | Status: DC

## 2018-10-24 MED ORDER — HYDROMORPHONE HCL 1 MG/ML IJ SOLN
INTRAMUSCULAR | Status: AC
  Administered 2018-10-24: 1 mg via INTRAVENOUS
  Filled 2018-10-24: qty 1

## 2018-10-24 MED ORDER — BACITRACIN ZINC 500 UNIT/GM EX OINT: TOPICAL_OINTMENT | CUTANEOUS | Status: DC | PRN

## 2018-10-24 MED ORDER — MIDAZOLAM HCL 2 MG/2ML IJ SOLN
INTRAMUSCULAR | Status: AC
  Administered 2018-10-24: 1 mg via INTRAVENOUS
  Filled 2018-10-24: qty 2

## 2018-10-24 MED ORDER — BACITRACIN-NEOMYCIN-POLYMYXIN OINTMENT TUBE
TOPICAL_OINTMENT | CUTANEOUS | Status: DC | PRN
  Administered 2018-10-24 (×6): via TOPICAL
  Filled 2018-10-24: qty 14.17

## 2018-10-24 MED ORDER — LACTATED RINGERS IV SOLN
INTRAVENOUS | Status: DC
  Administered 2018-10-24 (×2): via INTRAVENOUS

## 2018-10-24 MED ORDER — SODIUM CHLORIDE 0.9 % IV BOLUS (SEPSIS)
1000.0000 mL | Freq: Once | INTRAVENOUS | Status: AC
  Administered 2018-10-24: 1000 mL via INTRAVENOUS

## 2018-10-24 MED ORDER — MIDAZOLAM HCL 2 MG/2ML IJ SOLN
1.0000 mg | Freq: Once | INTRAMUSCULAR | Status: AC
  Administered 2018-10-24: 1 mg via INTRAVENOUS

## 2018-10-24 MED ORDER — KETOROLAC TROMETHAMINE 30 MG/ML IJ SOLN
30.0000 mg | Freq: Once | INTRAMUSCULAR | Status: AC
  Administered 2018-10-24: 30 mg via INTRAVENOUS

## 2018-10-24 MED ORDER — HYDROMORPHONE HCL 1 MG/ML IJ SOLN
INTRAMUSCULAR | Status: AC
  Administered 2018-10-24: 1 mg
  Filled 2018-10-24: qty 1

## 2018-10-24 MED ORDER — KETOROLAC TROMETHAMINE 30 MG/ML IJ SOLN
INTRAMUSCULAR | Status: AC
  Administered 2018-10-24: 30 mg via INTRAVENOUS
  Filled 2018-10-24: qty 1

## 2018-10-24 MED ORDER — SODIUM CHLORIDE 0.9 % IV BOLUS
1000.0000 mL | Freq: Once | INTRAVENOUS | Status: AC
  Administered 2018-10-24: 1000 mL via INTRAVENOUS

## 2018-10-24 NOTE — ED Notes (Signed)
Pt refused to have silvadene cream, but allowed [place] to but neosporin ointment all over burns and wrapped with sterile water soaked gauze. All burns were covered prior to transport to [place].
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
## 2018-11-27 ENCOUNTER — Emergency Department (HOSPITAL_COMMUNITY)
Admission: EM | Admit: 2018-11-27 | Discharge: 2018-11-27 | Disposition: A | Discharge status: Home / Self Care | Payer: Medicaid Other

## 2018-11-30 ENCOUNTER — Other Ambulatory Visit: Payer: Self-pay

## 2018-11-30 ENCOUNTER — Encounter (HOSPITAL_COMMUNITY): Payer: Self-pay | Admitting: Occupational Therapy

## 2018-11-30 ENCOUNTER — Ambulatory Visit (HOSPITAL_COMMUNITY): Payer: Medicaid Other | Attending: Surgical | Admitting: Occupational Therapy

## 2018-11-30 DIAGNOSIS — R29898 Other symptoms and signs involving the musculoskeletal system: Secondary | ICD-10-CM | POA: Diagnosis present

## 2018-11-30 DIAGNOSIS — M25521 Pain in right elbow: Secondary | ICD-10-CM | POA: Insufficient documentation

## 2018-11-30 DIAGNOSIS — T3120 Burns involving 20-29% of body surface with 0% to 9% third degree burns: Secondary | ICD-10-CM | POA: Diagnosis present
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
## 2018-12-07 ENCOUNTER — Telehealth (HOSPITAL_COMMUNITY): Payer: Self-pay | Admitting: Occupational Therapy

## 2018-12-07 NOTE — Telephone Encounter (Signed)
Called pt to follow up on splint and RUE extension. Left message, asked pt to return call and schedule if splint needs adjusting.    Ezra Sites, OTR/L  [phone] 12/07/2018

## 2018-12-14 ENCOUNTER — Telehealth (HOSPITAL_COMMUNITY): Payer: Self-pay | Admitting: Occupational Therapy

## 2018-12-14 NOTE — Telephone Encounter (Signed)
Left message for pt to follow up on splint fit and make appointment to adjust if necessary. Asked pt to return call.    Ezra Sites, OTR/L  [phone] 12/14/2018

## 2019-01-31 IMAGING — US US ABDOMEN LIMITED
1 series · 14 of 25 positions shown · non-contrast
Comparison: 11/13/2013

CLINICAL DATA: Upper abdominal pain for 2 days

EXAM:
US ABDOMEN LIMITED - RIGHT UPPER QUADRANT

[Series 1: us abdomen limited · 0.21mm/px · 14 of 81 slices shown]
[im 1/81]
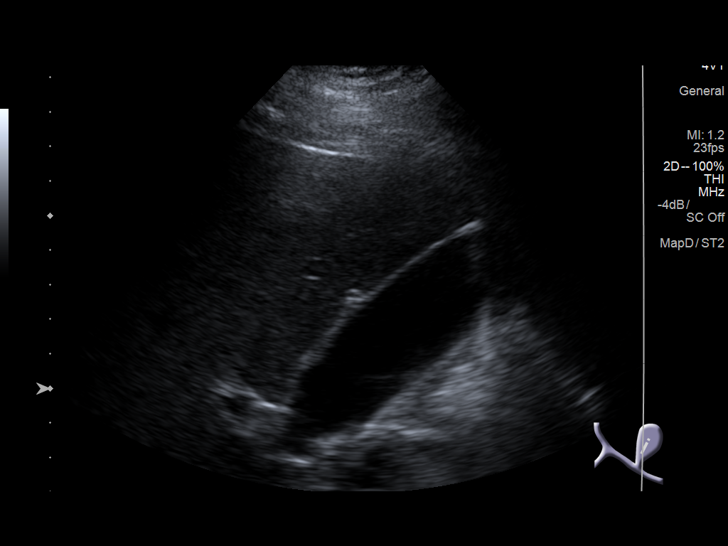
[im 7/81]
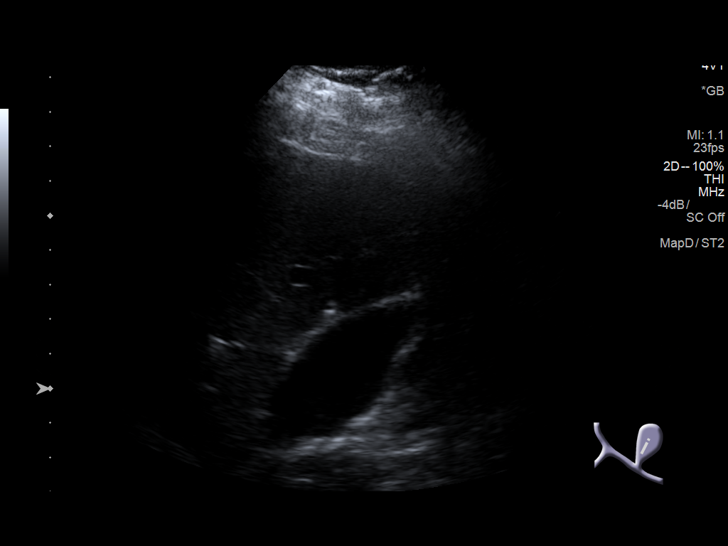
[im 14/81]
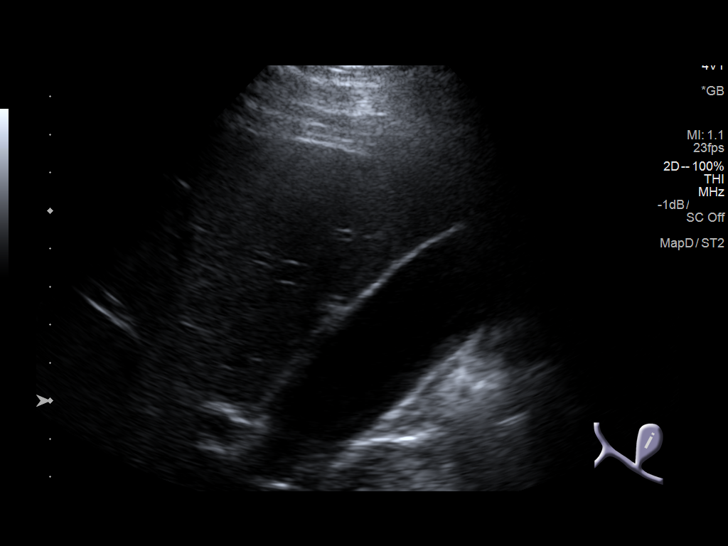
[im 21/81]
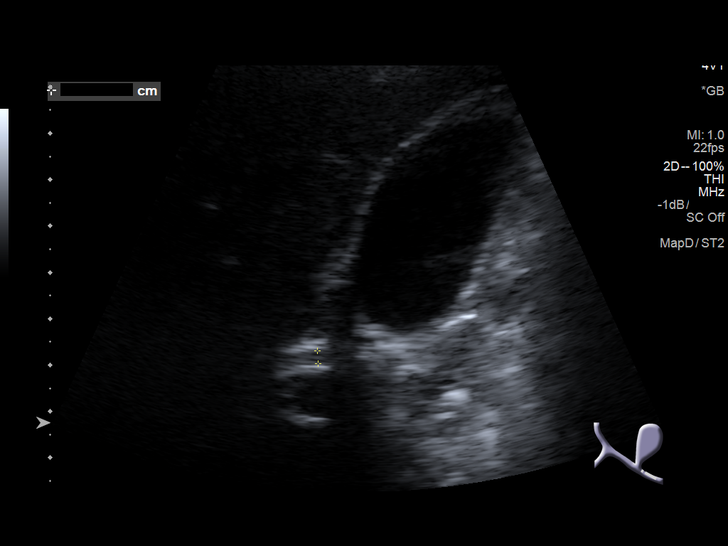
[im 27/81]
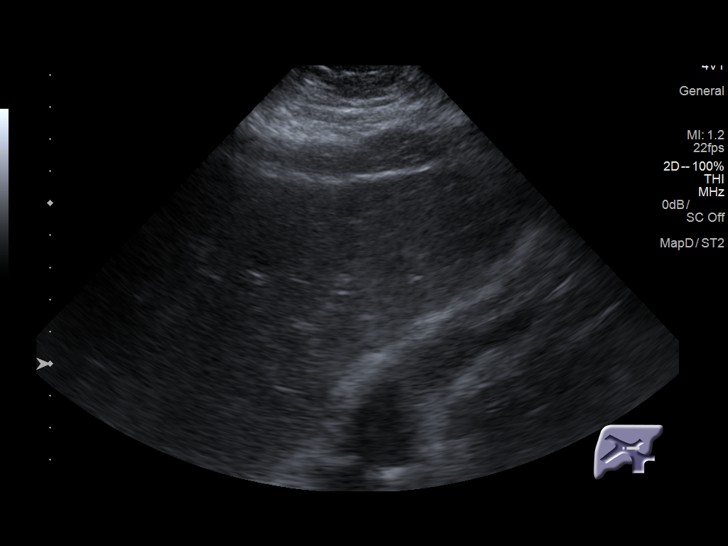
[im 31/81]
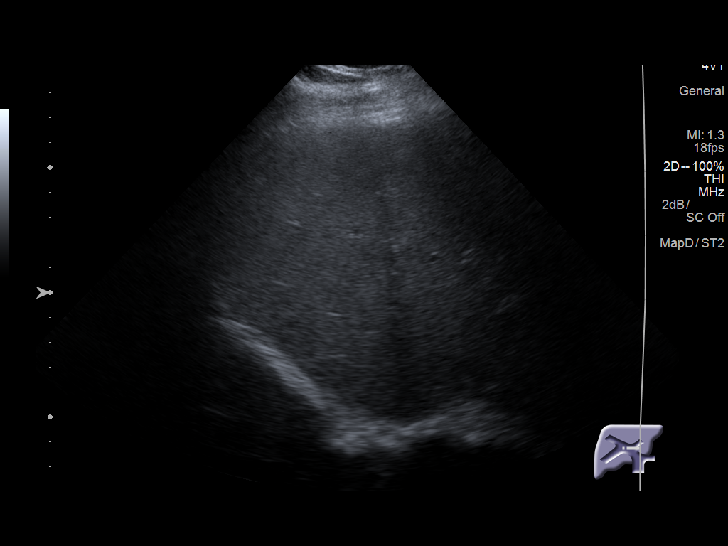
[im 37/81]
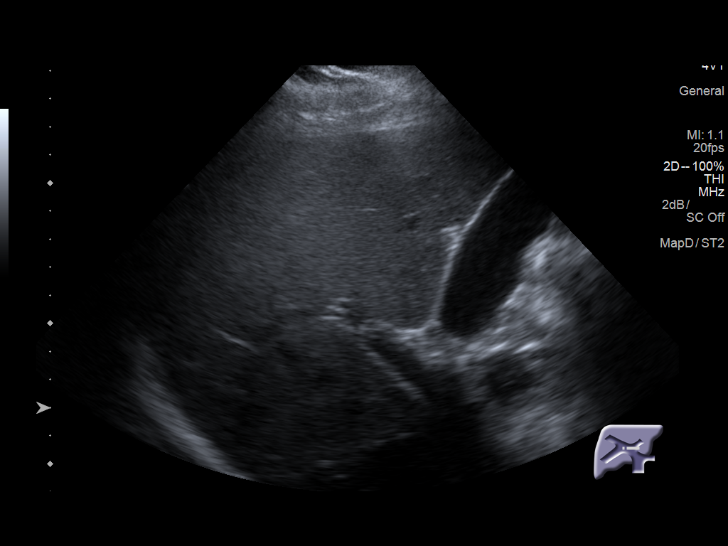
[im 44/81]
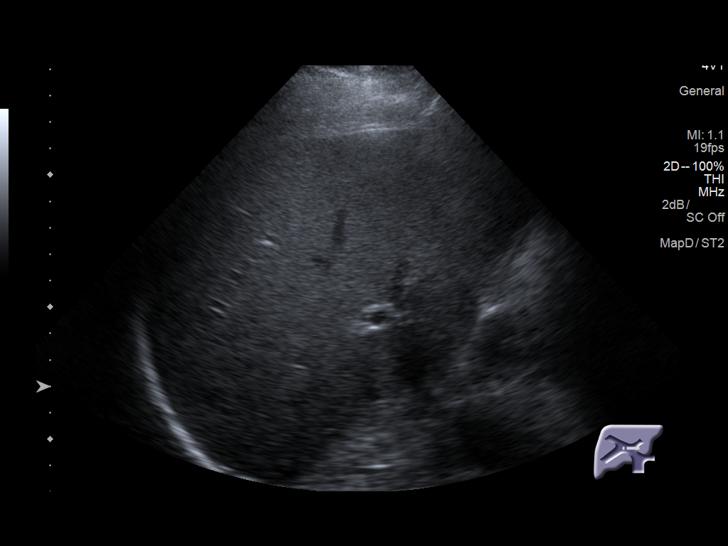
[im 51/81]
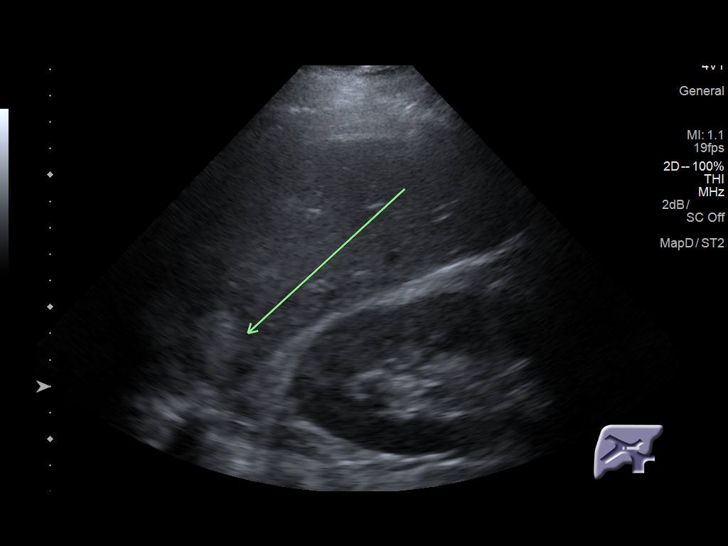
[im 54/81]
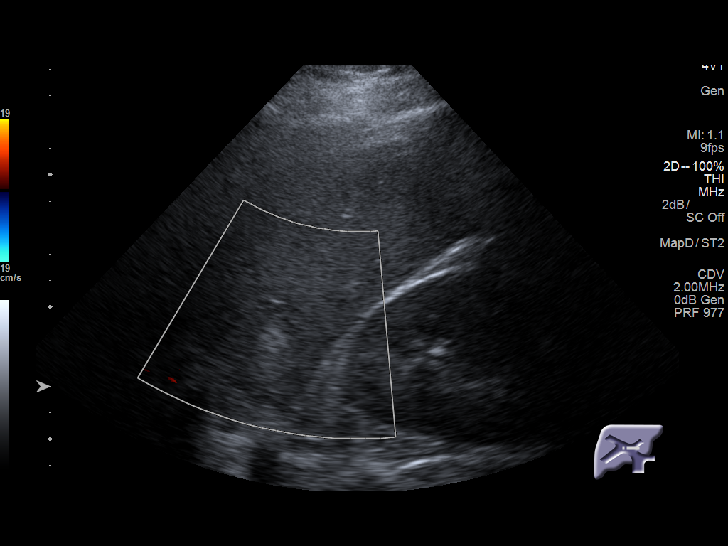
[im 61/81]
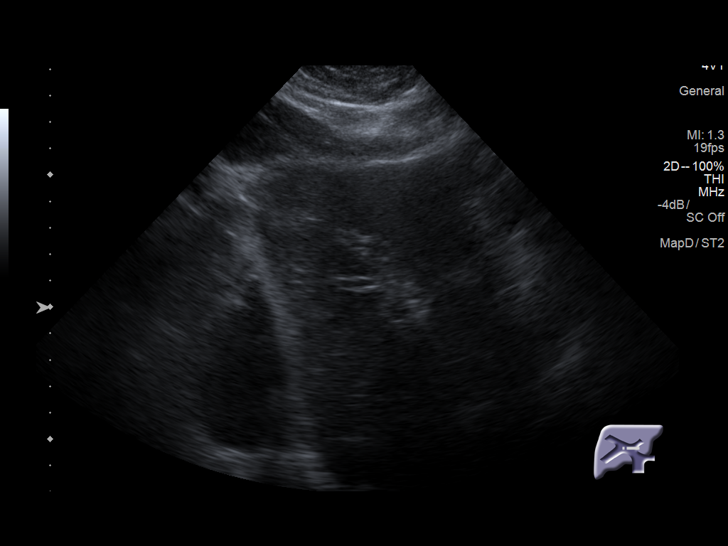
[im 67/81]
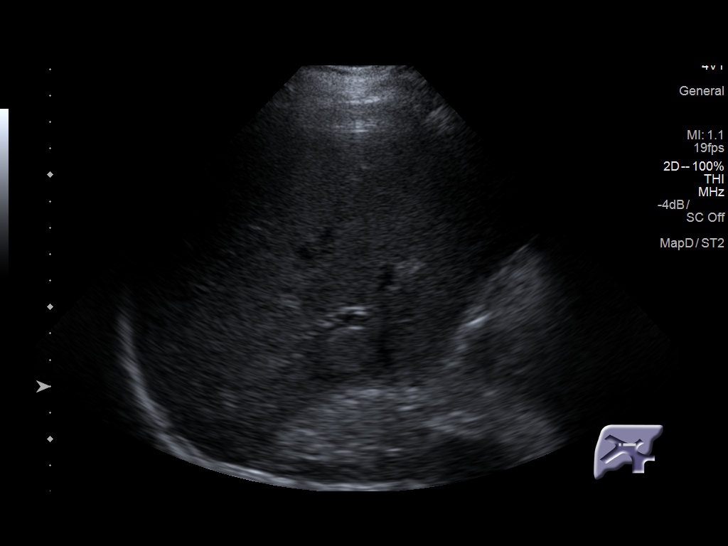
[im 74/81]
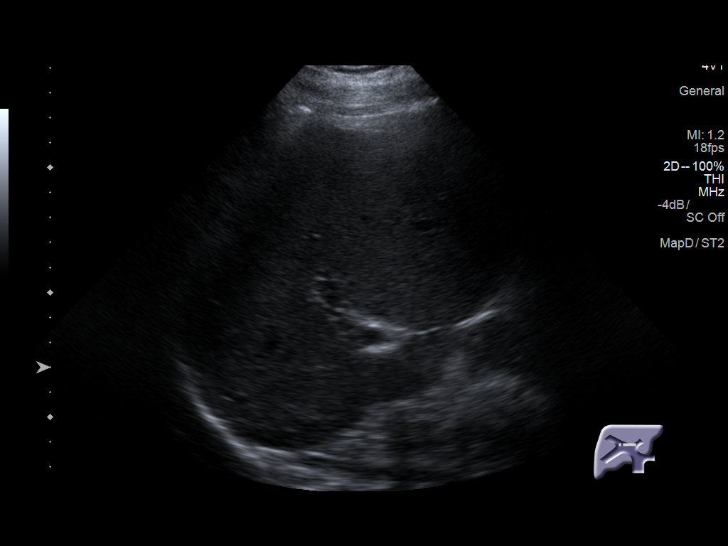
[im 81/81]
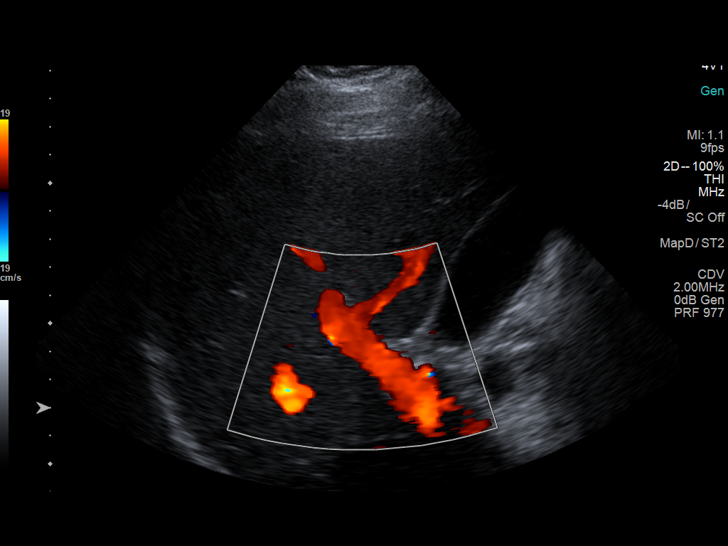

[14 of 25 positions shown; findings below may reference images not displayed]

FINDINGS: Gallbladder:

No gallstones or wall thickening visualized. No sonographic Murphy
sign noted by sonographer.

Common bile duct:

Diameter: 3 mm

Liver:

Normal echogenicity. No intrahepatic biliary dilatation. Patent
portal vein with normal hepatopetal flow. Posterior right hepatic
hyperechoic mass measures 2.1 x 2.3 x 1.7 cm. By comparison MRI
11/13/2013, this has previously been demonstrated to be a
hemangioma.
IMPRESSION: Negative for gallstones or biliary dilatation. No acute finding by
ultrasound.

2.3 cm posterior right hepatic echogenic mass compatible with
hemangioma.

## 2019-05-28 ENCOUNTER — Other Ambulatory Visit: Payer: Self-pay | Admitting: *Deleted

## 2019-05-28 DIAGNOSIS — Z20822 Contact with and (suspected) exposure to covid-19: Secondary | ICD-10-CM

## 2019-05-31 LAB — NOVEL CORONAVIRUS, NAA: SARS-CoV-2, NAA: NOT DETECTED

## 2020-03-05 ENCOUNTER — Emergency Department (HOSPITAL_COMMUNITY)
Admission: EM | Admit: 2020-03-05 | Discharge: 2020-03-05 | Disposition: A | Discharge status: Home / Self Care | Payer: Medicaid Other | Attending: Emergency Medicine | Admitting: Emergency Medicine

## 2020-03-05 ENCOUNTER — Ambulatory Visit
Admission: EM | Admit: 2020-03-05 | Discharge: 2020-03-05 | Disposition: A | Discharge status: Home / Self Care | Payer: Medicaid Other

## 2020-03-05 ENCOUNTER — Encounter (HOSPITAL_COMMUNITY): Payer: Self-pay | Admitting: Emergency Medicine

## 2020-03-05 ENCOUNTER — Other Ambulatory Visit: Payer: Self-pay

## 2020-03-05 ENCOUNTER — Emergency Department (HOSPITAL_COMMUNITY): Payer: Medicaid Other

## 2020-03-05 DIAGNOSIS — N50812 Left testicular pain: Secondary | ICD-10-CM | POA: Diagnosis not present

## 2020-03-05 DIAGNOSIS — F17210 Nicotine dependence, cigarettes, uncomplicated: Secondary | ICD-10-CM | POA: Insufficient documentation

## 2020-03-05 DIAGNOSIS — R102 Pelvic and perineal pain: Secondary | ICD-10-CM

## 2020-03-05 LAB — BASIC METABOLIC PANEL
Anion gap: 10 (ref 5–15)
BUN: 9 mg/dL (ref 6–20)
CO2: 22 mmol/L (ref 22–32)
Calcium: 8.7 mg/dL — ABNORMAL LOW (ref 8.9–10.3)
Chloride: 103 mmol/L (ref 98–111)
Creatinine, Ser: 0.74 mg/dL (ref 0.61–1.24)
GFR calc Af Amer: >60 mL/min (ref >60)
GFR calc non Af Amer: >60 mL/min (ref >60)
Glucose, Bld: 101 mg/dL — ABNORMAL HIGH (ref 70–99)
Potassium: 3.5 mmol/L (ref 3.5–5.1)
Sodium: 135 mmol/L (ref 135–145)

## 2020-03-05 LAB — CBC WITH DIFFERENTIAL/PLATELET
Abs Immature Granulocytes: 0.03 10*3/uL (ref 0.00–0.07)
Basophils Absolute: 0.1 10*3/uL (ref 0.0–0.1)
Basophils Relative: 1 %
Eosinophils Absolute: 0.3 10*3/uL (ref 0.0–0.5)
Eosinophils Relative: 3 %
HCT: 47.2 % (ref 39.0–52.0)
Hemoglobin: 17 g/dL (ref 13.0–17.0)
Immature Granulocytes: 0 %
Lymphocytes Relative: 21 %
Lymphs Abs: 2.2 10*3/uL (ref 0.7–4.0)
MCH: 35.9 pg — ABNORMAL HIGH (ref 26.0–34.0)
MCHC: 36 g/dL (ref 30.0–36.0)
MCV: 99.8 fL (ref 80.0–100.0)
Monocytes Absolute: 0.6 10*3/uL (ref 0.1–1.0)
Monocytes Relative: 5 %
Neutro Abs: 7.1 10*3/uL (ref 1.7–7.7)
Neutrophils Relative %: 70 %
Platelets: 217 10*3/uL (ref 150–400)
RBC: 4.73 MIL/uL (ref 4.22–5.81)
RDW: 13 % (ref 11.5–15.5)
WBC: 10.2 10*3/uL (ref 4.0–10.5)
nRBC: 0 % (ref 0.0–0.2)

## 2020-03-05 LAB — URINALYSIS, ROUTINE W REFLEX MICROSCOPIC
Bilirubin Urine: NEGATIVE
Glucose, UA: NEGATIVE mg/dL
Hgb urine dipstick: NEGATIVE
Ketones, ur: NEGATIVE mg/dL
Leukocytes,Ua: NEGATIVE
Nitrite: NEGATIVE
Protein, ur: NEGATIVE mg/dL
Specific Gravity, Urine: 1.021 (ref 1.005–1.030)
pH: 5 (ref 5.0–8.0)

## 2020-03-05 MED ORDER — SODIUM CHLORIDE 0.9 % IV BOLUS
1000.0000 mL | Freq: Once | INTRAVENOUS | Status: AC
  Administered 2020-03-05: 1000 mL via INTRAVENOUS

## 2020-03-05 MED ORDER — FENTANYL CITRATE (PF) 100 MCG/2ML IJ SOLN
50.0000 ug | Freq: Once | INTRAMUSCULAR | Status: AC
  Administered 2020-03-05: 50 ug via INTRAVENOUS
  Filled 2020-03-05: qty 2

## 2020-03-05 MED ORDER — NAPROXEN 500 MG PO TABS: 500.0000 mg | ORAL_TABLET | Freq: Two times a day (BID) | ORAL | 0 refills | Status: DC

## 2020-03-05 NOTE — ED Notes (Signed)
Given chicken salad sandwich and diet ginger-ale.

## 2020-03-05 NOTE — ED Provider Notes (Signed)
[facility] EMERGENCY DEPARTMENT Provider Note  CSN: [id number] Arrival date & time: 03/05/20 1452    History Chief Complaint  Patient presents with  . Testicle Pain    HPI  Johnny Shepherd is a 39 y.o. male Patient with no significant PMH reports he was at work today around 12:30pm when he began to have moderate aching pain in L testicle associated with tingling pain in thigh. No fever, no dysuria, no N/V/D. He has not had any penile discharge or sores. In a monogamous relationship.    Past Medical History:  Diagnosis Date  . Cervical radiculopathy    LUE weakness  . Chronic neck and back pain   . Congenital absence of one kidney   . Lumbar radiculopathy     History reviewed. No pertinent surgical history.  No family history on file.  Social History   Tobacco Use  . Smoking status: Current Every Day Smoker    Packs/day: 1.00    Years: 10.00    Pack years: 10.00    Types: Cigarettes  . Smokeless tobacco: Never Used  Vaping Use  . Vaping Use: Never used  Substance Use Topics  . Alcohol use: No    Comment: occ  . Drug use: No     Home Medications Prior to Admission medications   Medication Sig Start Date End Date Taking? Authorizing Provider  naproxen (NAPROSYN) 500 MG tablet Take 1 tablet (500 mg total) by mouth 2 (two) times daily. 03/05/20   Pollyann Savoy, MD     Allergies    Patient has no known allergies.   Review of Systems   Review of Systems A comprehensive review of systems was completed and negative except as noted in HPI.    Physical Exam BP 136/64 (BP Location: Left Arm)   Pulse 74   Temp 98.5 F (36.9 C) (Oral)   Resp 14   Ht 6' (1.829 m)   Wt 117.9 kg   SpO2 96%   BMI 35.26 kg/m   Physical Exam Vitals and nursing note reviewed.  Constitutional:      Appearance: Normal appearance.  HENT:     Head: Normocephalic and atraumatic.     Nose: Nose normal.     Mouth/Throat:     Mouth: Mucous membranes are moist.  Eyes:      Extraocular Movements: Extraocular movements intact.     Conjunctiva/sclera: Conjunctivae normal.  Cardiovascular:     Rate and Rhythm: Normal rate.  Pulmonary:     Effort: Pulmonary effort is normal.     Breath sounds: Normal breath sounds.  Abdominal:     General: Abdomen is flat.     Palpations: Abdomen is soft.     Tenderness: There is no abdominal tenderness.  Genitourinary:    Comments: Penis normal, no discharge or sores. R testicle normal lie, nontender. L testicle high riding, tender to palpation, minimal swelling. No hernia Musculoskeletal:        General: No swelling. Normal range of motion.     Cervical back: Neck supple.  Skin:    General: Skin is warm and dry.  Neurological:     General: No focal deficit present.     Mental Status: He is alert.  Psychiatric:        Mood and Affect: Mood normal.      ED Results / Procedures / Treatments   Labs (all labs ordered are listed, but only abnormal results are displayed) Labs Reviewed  CBC WITH  DIFFERENTIAL/PLATELET - Abnormal; Notable for the following components:      Result Value   MCH 35.9 (*)    All other components within normal limits  BASIC METABOLIC PANEL - Abnormal; Notable for the following components:   Glucose, Bld 101 (*)    Calcium 8.7 (*)    All other components within normal limits  URINALYSIS, ROUTINE W REFLEX MICROSCOPIC - Abnormal; Notable for the following components:   Color, Urine AMBER (*)    All other components within normal limits  URINE CULTURE  GC/CHLAMYDIA PROBE AMP ([place]) NOT AT [facility]    EKG None  Radiology US SCROTUM W/DOPPLER  Result Date: 03/05/2020 CLINICAL DATA:  LEFT testicular pain EXAM: SCROTAL ULTRASOUND DOPPLER ULTRASOUND OF THE TESTICLES TECHNIQUE: Complete ultrasound examination of the testicles, epididymis, and other scrotal structures was performed. Color and spectral Doppler ultrasound were also utilized to evaluate blood flow to the testicles. COMPARISON:   March 07, 2014. FINDINGS: Right testicle Measurements: 3.2 x 4.3 x 2.2 cm. No mass or microlithiasis visualized. Left testicle Measurements: 2.7 x 4.0 x 1.9 cm. No masses visualized. There is a punctate calcification visualized, unchanged. Right epididymis: Normal in size and appearance. It measures is 0.8 by 0.9 cm. Left epididymis: Normal in size and appearance. It measures 0.5 x 0.6 cm. Hydrocele: No significant hydrocele. Trace physiologic fluid is noted. Varicocele:  None visualized. Pulsed Doppler interrogation of both testes demonstrates normal low resistance arterial and venous waveforms bilaterally. IMPRESSION: No sonographic evidence of testicular torsion. Electronically Signed   By: Meda Klinefelter MD   On: 03/05/2020 16:41    Procedures Procedures  Medications Ordered in the ED Medications  sodium chloride 0.9 % bolus 1,000 mL (1,000 mLs Intravenous New Bag/Given 03/05/20 1602)  fentaNYL (SUBLIMAZE) injection 50 mcg (50 mcg Intravenous Given 03/05/20 1603)     MDM Rules/Calculators/A&P MDM Symptoms concerning for torsion, labs and [place] ordered. Consider orchitis, epididymitis, varicocele, etc as well. Pain medications as needed for pain.  ED Course  I have reviewed the triage vital signs and the nursing notes.  Pertinent labs & imaging results that were available during my care of the patient were reviewed by me and considered in my medical decision making (see chart for details).  Clinical Course as of Mar 05 1834  Thu Mar 05, 2020  1658 BMP normal, [place] neg for torsion or other acute process. Awaiting UA.    [CS]  1715 CBC is normal   [CS]  1819 Urine is negative for infection. Patient denies concern for STI. Will plan discharge with NSAIDs, scrotal elevation and urology followup.    [CS]    Clinical Course User Index [CS] Pollyann Savoy, MD    Final Clinical Impression(s) / ED Diagnoses Final diagnoses:  Testicular pain, left    Rx / DC Orders ED Discharge Orders          Ordered    naproxen (NAPROSYN) 500 MG tablet  2 times daily     Discontinue  Reprint     03/05/20 1835           Pollyann Savoy, MD 03/05/20 [phone]

## 2020-03-05 NOTE — ED Triage Notes (Signed)
Pt c/o sudden onset of LT testicle pain that radiates down LT leg around 1230 today. Denies urinary symptoms.

## 2020-03-05 NOTE — ED Triage Notes (Signed)
Patient is being discharged from the Urgent Care and sent to the Emergency Department via private vehicle  . Per Doyce Para, patient is in need of higher level of care due to possible testicular torsion *. Patient is aware and verbalizes understanding of plan of care. There were no vitals filed for this visit.

## 2020-03-06 LAB — GC/CHLAMYDIA PROBE AMP (~~LOC~~) NOT AT ARMC
Chlamydia: NEGATIVE
Comment: NEGATIVE
Comment: NORMAL
Neisseria Gonorrhea: NEGATIVE

## 2020-03-06 LAB — URINE CULTURE: Culture: NO GROWTH

## 2020-03-26 ENCOUNTER — Encounter: Payer: Self-pay | Admitting: Emergency Medicine

## 2020-03-26 ENCOUNTER — Ambulatory Visit
Admission: EM | Admit: 2020-03-26 | Discharge: 2020-03-26 | Disposition: A | Discharge status: Home / Self Care | Payer: Medicaid Other | Attending: Emergency Medicine | Admitting: Emergency Medicine

## 2020-03-26 ENCOUNTER — Other Ambulatory Visit: Payer: Self-pay

## 2020-03-26 DIAGNOSIS — R0602 Shortness of breath: Secondary | ICD-10-CM

## 2020-03-26 DIAGNOSIS — J069 Acute upper respiratory infection, unspecified: Secondary | ICD-10-CM

## 2020-03-26 DIAGNOSIS — R079 Chest pain, unspecified: Secondary | ICD-10-CM

## 2020-03-26 MED ORDER — BENZONATATE 100 MG PO CAPS: 100.0000 mg | ORAL_CAPSULE | Freq: Three times a day (TID) | ORAL | 0 refills | Status: DC

## 2020-03-26 NOTE — Discharge Instructions (Signed)
Unable to rule out cardiac disease or blood clot in urgent care setting secondary to possible covid infection.  Offered patient further evaluation and management in the ED.  Patient declines at this time and would like to try outpatient therapy first.  Aware of the risk associated with this decision including missed diagnosis, organ damage, organ failure, and/or death.  Patient aware and in agreement.     COVID testing ordered.  It will take between 4-5 days for test results.  Someone will contact you regarding abnormal results.    In the meantime: You should remain isolated in your home for 10 days from symptom onset AND greater than 72 hours after symptoms resolution (absence of fever without the use of fever-reducing medication and improvement in respiratory symptoms), whichever is longer Get plenty of rest and push fluids Tessalon Perles prescribed for cough Use OTC zyrtec for nasal congestion, runny nose, and/or sore throat Use OTC flonase for nasal congestion and runny nose Use medications daily for symptom relief Use OTC medications like ibuprofen or tylenol as needed fever or pain Call or go to the ED if you have any new or worsening symptoms such as fever, worsening cough, shortness of breath, chest tightness, chest pain, turning blue, changes in mental status, etc..Marland Kitchen

## 2020-03-26 NOTE — ED Triage Notes (Signed)
Pt triaged and dc by provider prior to rn

## 2020-03-26 NOTE — ED Provider Notes (Signed)
[facility]   [phone] 03/26/20 Arrival Time: 1505   CC: COVID symptoms  SUBJECTIVE: History from: patient.  BODE PIEPER is a 39 y.o. male who presents with runny nose, congestion, productive cough with clear sputum, chest pain, SOB, and fatigue x 1 day.  Admits to COVID exposure at work.  Has tried alleviating factors relief.  Symptoms are made worse at night.  Denies previous COVID infection in the past.   Did not receive covid vaccines.  Denies fever, chills, fatigue, sinus pain, rhinorrhea, sore throat, SOB, wheezing, chest pain, nausea, changes in bowel or bladder habits.     ROS: As per HPI.  All other pertinent ROS negative.     Past Medical History:  Diagnosis Date  . Cervical radiculopathy    LUE weakness  . Chronic neck and back pain   . Congenital absence of one kidney   . Lumbar radiculopathy    History reviewed. No pertinent surgical history. No Known Allergies No current facility-administered medications on file prior to encounter.   No current outpatient medications on file prior to encounter.   Social History   Socioeconomic History  . Marital status: Married    Spouse name: Not on file  . Number of children: Not on file  . Years of education: Not on file  . Highest education level: Not on file  Occupational History  . Not on file  Tobacco Use  . Smoking status: Current Every Day Smoker    Packs/day: 1.00    Years: 10.00    Pack years: 10.00    Types: Cigarettes  . Smokeless tobacco: Never Used  Vaping Use  . Vaping Use: Never used  Substance and Sexual Activity  . Alcohol use: No    Comment: occ  . Drug use: No  . Sexual activity: Never  Other Topics Concern  . Not on file  Social History Narrative  . Not on file   Social Determinants of Health   Financial Resource Strain:   . Difficulty of Paying Living Expenses: Not on file  Food Insecurity:   . Worried About Programme researcher, broadcasting/film/video in the Last Year: Not on file  . Ran  Out of Food in the Last Year: Not on file  Transportation Needs:   . Lack of Transportation (Medical): Not on file  . Lack of Transportation (Non-Medical): Not on file  Physical Activity:   . Days of Exercise per Week: Not on file  . Minutes of Exercise per Session: Not on file  Stress:   . Feeling of Stress : Not on file  Social Connections:   . Frequency of Communication with Friends and Family: Not on file  . Frequency of Social Gatherings with Friends and Family: Not on file  . Attends Religious Services: Not on file  . Active Member of Clubs or Organizations: Not on file  . Attends Banker Meetings: Not on file  . Marital Status: Not on file  Intimate Partner Violence:   . Fear of Current or Ex-Partner: Not on file  . Emotionally Abused: Not on file  . Physically Abused: Not on file  . Sexually Abused: Not on file   History reviewed. No pertinent family history.  OBJECTIVE:  Vitals:   03/26/20 1517  BP: 138/88  Pulse: 84  Resp: 17  Temp: 98.3 F (36.8 C)  TempSrc: Oral  SpO2: 96%     General appearance: alert; appears fatigued, but nontoxic; speaking in full sentences and tolerating  own secretions HEENT: NCAT; Ears: EACs clear, TMs pearly gray; Eyes: PERRL.  EOM grossly intact. Nose: nares patent with clear rhinorrhea, Throat: oropharynx clear, tonsils non erythematous or enlarged, uvula midline  Neck: supple without LAD Lungs: unlabored respirations, symmetrical air entry; cough: absent; no respiratory distress; CTAB Heart: regular rate and rhythm.   Skin: warm and dry Psychological: alert and cooperative; normal mood and affect   ASSESSMENT & PLAN:  1. Viral URI with cough   2. Chest pain, unspecified type   3. SOB (shortness of breath)     Meds ordered this encounter  Medications  . benzonatate (TESSALON) 100 MG capsule    Sig: Take 1 capsule (100 mg total) by mouth every 8 (eight) hours.    Dispense:  21 capsule    Refill:  0    Order  Specific Question:   Supervising Provider    Answer:   Eustace Moore [[id number]]   Unable to rule out cardiac disease or blood clot in urgent care setting secondary to possible covid infection.  Offered patient further evaluation and management in the ED.  Patient declines at this time and would like to try outpatient therapy first.  Aware of the risk associated with this decision including missed diagnosis, organ damage, organ failure, and/or death.  Patient aware and in agreement.     COVID testing ordered.  It will take between 4-5 days for test results.  Someone will contact you regarding abnormal results.    In the meantime: You should remain isolated in your home for 10 days from symptom onset AND greater than 72 hours after symptoms resolution (absence of fever without the use of fever-reducing medication and improvement in respiratory symptoms), whichever is longer Get plenty of rest and push fluids Tessalon Perles prescribed for cough Use OTC zyrtec for nasal congestion, runny nose, and/or sore throat Use OTC flonase for nasal congestion and runny nose Use medications daily for symptom relief Use OTC medications like ibuprofen or tylenol as needed fever or pain Call or go to the ED if you have any new or worsening symptoms such as fever, worsening cough, shortness of breath, chest tightness, chest pain, turning blue, changes in mental status, etc...   Reviewed expectations re: course of current medical issues. Questions answered. Outlined signs and symptoms indicating need for more acute intervention. Patient verbalized understanding. After Visit Summary given.         Rennis Harding, PA-C 03/26/20 1523

## 2020-03-27 LAB — NOVEL CORONAVIRUS, NAA: SARS-CoV-2, NAA: NOT DETECTED

## 2020-03-27 LAB — SARS-COV-2, NAA 2 DAY TAT

## 2022-02-24 ENCOUNTER — Telehealth: Payer: Medicaid Other | Admitting: Physician Assistant

## 2022-02-24 DIAGNOSIS — K2990 Gastroduodenitis, unspecified, without bleeding: Secondary | ICD-10-CM | POA: Diagnosis not present

## 2022-02-24 MED ORDER — PANTOPRAZOLE SODIUM 40 MG PO TBEC: 40.0000 mg | DELAYED_RELEASE_TABLET | Freq: Every day | ORAL | 0 refills | Status: DC

## 2022-02-24 MED ORDER — SUCRALFATE 1 G PO TABS: 1.0000 g | ORAL_TABLET | Freq: Three times a day (TID) | ORAL | 0 refills | Status: DC

## 2022-02-24 MED ORDER — ONDANSETRON 4 MG PO TBDP: 4.0000 mg | ORAL_TABLET | Freq: Three times a day (TID) | ORAL | 0 refills | Status: DC | PRN

## 2022-02-24 NOTE — Patient Instructions (Signed)
Johnny Shepherd, thank you for joining Piedad Climes, PA-C for today's virtual visit.  While this provider is not your primary care provider (PCP), if your PCP is located in our provider database this encounter information will be shared with them immediately following your visit.  Consent: (Patient) Johnny Shepherd provided verbal consent for this virtual visit at the beginning of the encounter.  Current Medications:  Current Outpatient Medications:    benzonatate (TESSALON) 100 MG capsule, Take 1 capsule (100 mg total) by mouth every 8 (eight) hours., Disp: 21 capsule, Rfl: 0   Medications ordered in this encounter:  No orders of the defined types were placed in this encounter.    *If you need refills on other medications prior to your next appointment, please contact your pharmacy*  Follow-Up: Call back or seek an in-person evaluation if the symptoms worsen or if the condition fails to improve as anticipated.  Other Instructions Keep hydrated. Avoid any heavy foods, NSAIDs (ibuprofen, aleve, goody powders) or any alcohol consumption. Tylenol if needed for your chronic pains.  Start the Protonix and the Sucralfate (Carafate) as directed.  The Zofran is to use as directed, if needed, for nausea or any vomiting. When you feel up to eating, follow dietary recommendations below.  If anything is not substantially improving within 24-48 hours, or if you note any new or worsening of symptoms despite treatment, please seek ER evaluation.  I want you to schedule follow-up with your PCP for either tomorrow if available or next week. Giving your history just want to make sure that your renal function is assessed.   Food Choices for Gastroesophageal Reflux Disease, Adult When you have gastroesophageal reflux disease (GERD), the foods you eat and your eating habits are very important. Choosing the right foods can help ease your discomfort. Think about working with a food expert (dietitian)  to help you make good choices. What are tips for following this plan? Reading food labels Look for foods that are low in saturated fat. Foods that may help with your symptoms include: Foods that have less than 5% of daily value (DV) of fat. Foods that have 0 grams of trans fat. Cooking Do not fry your food. Cook your food by baking, steaming, grilling, or broiling. These are all methods that do not need a lot of fat for cooking. To add flavor, try to use herbs that are low in spice and acidity. Meal planning  Choose healthy foods that are low in fat, such as: Fruits and vegetables. Whole grains. Low-fat dairy products. Lean meats, fish, and poultry. Eat small meals often instead of eating 3 large meals each day. Eat your meals slowly in a place where you are relaxed. Avoid bending over or lying down until 2-3 hours after eating. Limit high-fat foods such as fatty meats or fried foods. Limit your intake of fatty foods, such as oils, butter, and shortening. Avoid the following as told by your doctor: Foods that cause symptoms. These may be different for different people. Keep a food diary to keep track of foods that cause symptoms. Alcohol. Drinking a lot of liquid with meals. Eating meals during the 2-3 hours before bed. Lifestyle Stay at a healthy weight. Ask your doctor what weight is healthy for you. If you need to lose weight, work with your doctor to do so safely. Exercise for at least 30 minutes on 5 or more days each week, or as told by your doctor. Wear loose-fitting clothes. Do not smoke  or use any products that contain nicotine or tobacco. If you need help quitting, ask your doctor. Sleep with the head of your bed higher than your feet. Use a wedge under the mattress or blocks under the bed frame to raise the head of the bed. Chew sugar-free gum after meals. What foods should eat?  Eat a healthy, well-balanced diet of fruits, vegetables, whole grains, low-fat dairy  products, lean meats, fish, and poultry. Each person is different. Foods that may cause symptoms in one person may not cause any symptoms in another person. Work with your doctor to find foods that are safe for you. The items listed above may not be a complete list of what you can eat and drink. Contact a food expert for more options. What foods should I avoid? Limiting some of these foods may help in managing the symptoms of GERD. Everyone is different. Talk with a food expert or your doctor to help you find the exact foods to avoid, if any. Fruits Any fruits prepared with added fat. Any fruits that cause symptoms. For some people, this may include citrus fruits, such as oranges, grapefruit, pineapple, and lemons. Vegetables Deep-fried vegetables. [place] fries. Any vegetables prepared with added fat. Any vegetables that cause symptoms. For some people, this may include tomatoes and tomato products, chili peppers, onions and garlic, and horseradish. Grains Pastries or quick breads with added fat. Meats and other proteins High-fat meats, such as fatty beef or pork, hot dogs, ribs, ham, sausage, salami, and bacon. Fried meat or protein, including fried fish and fried chicken. Nuts and nut butters, in large amounts. Dairy Whole milk and chocolate milk. Sour cream. Cream. Ice cream. Cream cheese. Milkshakes. Fats and oils Butter. Margarine. Shortening. Ghee. Beverages Coffee and tea, with or without caffeine. Carbonated beverages. Sodas. Energy drinks. Fruit juice made with acidic fruits, such as orange or grapefruit. Tomato juice. Alcoholic drinks. Sweets and desserts Chocolate and cocoa. Donuts. Seasonings and condiments Pepper. Peppermint and spearmint. Added salt. Any condiments, herbs, or seasonings that cause symptoms. For some people, this may include curry, hot sauce, or vinegar-based salad dressings. The items listed above may not be a complete list of what you should not eat and drink.  Contact a food expert for more options. Questions to ask your doctor Diet and lifestyle changes are often the first steps that are taken to manage symptoms of GERD. If diet and lifestyle changes do not help, talk with your doctor about taking medicines. Where to find more information International Foundation for Gastrointestinal Disorders: aboutgerd.org Summary When you have GERD, food and lifestyle choices are very important in easing your symptoms. Eat small meals often instead of 3 large meals a day. Eat your meals slowly and in a place where you are relaxed. Avoid bending over or lying down until 2-3 hours after eating. Limit high-fat foods such as fatty meats or fried foods. This information is not intended to replace advice given to you by your health care provider. Make sure you discuss any questions you have with your health care provider. Document Revised: 01/27/2020 Document Reviewed: 01/27/2020 Elsevier Patient Education  2023 Elsevier Inc.    If you have been instructed to have an in-person evaluation today at a local Urgent Care facility, please use the link below. It will take you to a list of all of our available [facility], including address, phone number and hours of operation. Please do not delay care.  [facility]  If  you or a family member do not have a primary care provider, use the link below to schedule a visit and establish care. When you choose a [facility] primary care physician or advanced practice provider, you gain a long-term partner in health. Find a Primary Care Provider  Learn more about [facility] in-office and virtual care options: [facility]

## 2022-02-24 NOTE — Progress Notes (Signed)
Virtual Visit Consent   Johnny Shepherd, you are scheduled for a virtual visit with a [facility] provider today. Just as with appointments in the office, your consent must be obtained to participate. Your consent will be active for this visit and any virtual visit you may have with one of our providers in the next 365 days. If you have a MyChart account, a copy of this consent can be sent to you electronically.  As this is a virtual visit, video technology does not allow for your provider to perform a traditional examination. This may limit your provider's ability to fully assess your condition. If your provider identifies any concerns that need to be evaluated in person or the need to arrange testing (such as labs, EKG, etc.), we will make arrangements to do so. Although advances in technology are sophisticated, we cannot ensure that it will always work on either your end or our end. If the connection with a video visit is poor, the visit may have to be switched to a telephone visit. With either a video or telephone visit, we are not always able to ensure that we have a secure connection.  By engaging in this virtual visit, you consent to the provision of healthcare and authorize for your insurance to be billed (if applicable) for the services provided during this visit. Depending on your insurance coverage, you may receive a charge related to this service.  I need to obtain your verbal consent now. Are you willing to proceed with your visit today? Johnny Shepherd has provided verbal consent on 02/24/2022 for a virtual visit (video or telephone). Piedad Climes, [place]  Date: 02/24/2022 7:48 PM  Virtual Visit via Video Note   I, Piedad Climes, connected with  Johnny Shepherd  ([id number], 1981/03/31) on 02/24/22 at  7:30 PM EDT by a video-enabled telemedicine application and verified that I am speaking with the correct person using two identifiers.  Location: Patient: Virtual Visit Location  Patient: Mobile Provider: Virtual Visit Location Provider: Home Office   I discussed the limitations of evaluation and management by telemedicine and the availability of in person appointments. The patient expressed understanding and agreed to proceed.    History of Present Illness: Johnny Shepherd is a 41 y.o. who identifies as a male who was assigned male at birth, and is being seen today for stomach pain starting earlier today with twisting and burning pain located in the LUQ and epigastric region. Ranks pain about a 8/10 at its worst. Other times is mild.. Denies fever, chills. Notes some loose stool without melena or hematochezia. One episode of emesis. Denies recent travel or sick contact. Does note that he had milder pain for the past 2 days but was very intermittent. Does endorse use of Ibuprofen 800 mg a few times daily for some time. Is also drinking every other night, about 6 beers at a time. Notes heartburn and digestion with current symptoms, especially after eating some spicy chicken.   HPI: HPI  Problems:  Patient Active Problem List   Diagnosis Date Noted   Cervical radiculopathy 10/27/2013   Weakness 10/26/2013   Weakness of left arm 10/26/2013   Tobacco abuse 10/26/2013    Allergies: No Known Allergies Medications:  Current Outpatient Medications:    ondansetron (ZOFRAN-ODT) 4 MG disintegrating tablet, Take 1 tablet (4 mg total) by mouth every 8 (eight) hours as needed for nausea or vomiting., Disp: 20 tablet, Rfl: 0   pantoprazole (PROTONIX) 40 MG  tablet, Take 1 tablet (40 mg total) by mouth daily., Disp: 30 tablet, Rfl: 0   sucralfate (CARAFATE) 1 g tablet, Take 1 tablet (1 g total) by mouth 4 (four) times daily -  with meals and at bedtime., Disp: 30 tablet, Rfl: 0  Observations/Objective: Patient is well-developed, well-nourished in no acute distress.  Resting comfortably at home.  Head is normocephalic, atraumatic.  No labored breathing. Speech is clear and coherent  with logical content.  Patient is alert and oriented at baseline.   Assessment and Plan: 1. Gastritis and duodenitis - ondansetron (ZOFRAN-ODT) 4 MG disintegrating tablet; Take 1 tablet (4 mg total) by mouth every 8 (eight) hours as needed for nausea or vomiting.  Dispense: 20 tablet; Refill: 0 - pantoprazole (PROTONIX) 40 MG tablet; Take 1 tablet (40 mg total) by mouth daily.  Dispense: 30 tablet; Refill: 0 - sucralfate (CARAFATE) 1 g tablet; Take 1 tablet (1 g total) by mouth 4 (four) times daily -  with meals and at bedtime.  Dispense: 30 tablet; Refill: 0  Afebrile. No melena or hematochezia. Milder pain is more consistent with episodes of increased pain. Concern for moderate gastritis due to consistent NSAID use, alcohol use and poor diet. Will start good hydration. Protonix 40 mg daily for the next 2 weeks. Start Carafate TID and QHS for the next few days. Zofran ODT as needed. Giving history of unilateral kidney, strict ER precautions reviewed. He is to schedule follow-up with PCP ASAP for assessment of renal function.   Follow Up Instructions: I discussed the assessment and treatment plan with the patient. The patient was provided an opportunity to ask questions and all were answered. The patient agreed with the plan and demonstrated an understanding of the instructions.  A copy of instructions were sent to the patient via MyChart unless otherwise noted below.   The patient was advised to call back or seek an in-person evaluation if the symptoms worsen or if the condition fails to improve as anticipated.  Time:  I spent 12 minutes with the patient via telehealth technology discussing the above problems/concerns.    Piedad Climes, PA-C

## 2022-10-10 ENCOUNTER — Telehealth: Payer: Medicaid Other | Admitting: Physician Assistant

## 2022-10-10 DIAGNOSIS — R079 Chest pain, unspecified: Secondary | ICD-10-CM

## 2022-10-10 NOTE — Progress Notes (Signed)
Based on what you shared with me, I feel your condition warrants further evaluation as soon as possible at an Emergency department.    NOTE: There will be NO CHARGE for this eVisit   If you are having a true medical emergency please call 911.      Emergency [facility]  Get Driving Directions  [id number]  [address]  [place], [place] [address]  Open 24/7/365      [facility] Emergency Department at [place]  [id number]  [place], [place] [address]  Open 24/7/365    Emergency [facility]  Get Driving Directions  [id number]  [address], [place] [address]  Open 24/7/365      [facility]  Get Driving Directions  [id number]  [address]  [place], [place] [address]  Open 24/7/365    [facility]  Emergency [place]  Get Driving Directions  [id number]  [place], [place] [address]  Open 24/7/365    [facility]  Emergency Department- [place]  Get Driving Directions  [id number]  [place], [place] [address]  Open 24/7/365    [facility]  Emergency [facility]  Get Driving Directions  [id number]  [address]  [place], [place] [address]  Open 24/7/365    I have spent 5 minutes in review of e-visit questionnaire, review and updating patient chart, medical decision making and response to patient.   Mar Daring, PA-C

## 2022-11-29 ENCOUNTER — Telehealth: Payer: Medicaid Other | Admitting: Physician Assistant

## 2022-11-29 DIAGNOSIS — J301 Allergic rhinitis due to pollen: Secondary | ICD-10-CM

## 2022-11-29 DIAGNOSIS — H1013 Acute atopic conjunctivitis, bilateral: Secondary | ICD-10-CM

## 2022-11-29 MED ORDER — LEVOCETIRIZINE DIHYDROCHLORIDE 5 MG PO TABS: 5.0000 mg | ORAL_TABLET | Freq: Every evening | ORAL | 0 refills | Status: DC

## 2022-11-29 MED ORDER — OLOPATADINE HCL 0.1 % OP SOLN: 1.0000 [drp] | Freq: Two times a day (BID) | OPHTHALMIC | 0 refills | Status: DC

## 2022-11-29 MED ORDER — FLUTICASONE PROPIONATE 50 MCG/ACT NA SUSP: 2.0000 | Freq: Every day | NASAL | 0 refills | Status: DC

## 2022-11-29 NOTE — Progress Notes (Signed)
E visit for Allergic Rhinitis We are sorry that you are not feeling well.  Here is how we plan to help!  Based on what you have shared with me it looks like you have Allergic Rhinitis.  Rhinitis is when a reaction occurs that causes nasal congestion, runny nose, sneezing, and itching.  Most types of rhinitis are caused by an inflammation and are associated with symptoms in the eyes ears or throat. There are several types of rhinitis.  The most common are acute rhinitis, which is usually caused by a viral illness, allergic or seasonal rhinitis, and nonallergic or year-round rhinitis.  Nasal allergies occur certain times of the year.  Allergic rhinitis is caused when allergens in the air trigger the release of histamine in the body.  Histamine causes itching, swelling, and fluid to build up in the fragile linings of the nasal passages, sinuses and eyelids.  An itchy nose and clear discharge are common.  I recommend the following over the counter treatments: You should take a daily dose of antihistamine and Xyzal 5 mg take 1 tablet daily; prescribed  I also would recommend a nasal spray: Flonase 2 sprays into each nostril once daily; prescribed  You may also benefit from eye drops such as: Olopatadine 0.1% eye drops Place 1 drop in each eye every 12 hours   HOME CARE:  You can use an over-the-counter saline nasal spray as needed Avoid areas where there is heavy dust, mites, or molds Stay indoors on windy days during the pollen season Keep windows closed in home, at least in bedroom; use air conditioner. Use high-efficiency house air filter Keep windows closed in car, turn AC on re-circulate Avoid playing out with dog during pollen season  GET HELP RIGHT AWAY IF:  If your symptoms do not improve within 10 days You become short of breath You develop yellow or green discharge from your nose for over 3 days You have coughing fits  MAKE SURE YOU:  Understand these instructions Will watch  your condition Will get help right away if you are not doing well or get worse  Thank you for choosing an e-visit. Your e-visit answers were reviewed by a board certified advanced clinical practitioner to complete your personal care plan. Depending upon the condition, your plan could have included both over the counter or prescription medications. Please review your pharmacy choice. Be sure that the pharmacy you have chosen is open so that you can pick up your prescription now.  If there is a problem you may message your provider in MyChart to have the prescription routed to another pharmacy. Your safety is important to [place]. If you have drug allergies check your prescription carefully.  For the next 24 hours, you can use MyChart to ask questions about today's visit, request a non-urgent call back, or ask for a work or school excuse from your e-visit provider. You will get an email in the next two days asking about your experience. I hope that your e-visit has been valuable and will speed your recovery.   I have spent 5 minutes in review of e-visit questionnaire, review and updating patient chart, medical decision making and response to patient.   Margaretann Loveless, PA-C

## 2022-12-05 ENCOUNTER — Telehealth: Payer: Medicaid Other | Admitting: Physician Assistant

## 2022-12-05 DIAGNOSIS — L255 Unspecified contact dermatitis due to plants, except food: Secondary | ICD-10-CM | POA: Diagnosis not present

## 2022-12-05 MED ORDER — PREDNISONE 10 MG PO TABS
ORAL_TABLET | ORAL | 0 refills | Status: DC
Start: 2022-12-05 — End: 2023-03-30

## 2022-12-05 NOTE — Progress Notes (Signed)
E-Visit for Poison Ivy  We are sorry that you are not feeing well.  Here is how we plan to help!  Based on what you have shared with me it looks like you have had an allergic reaction to the oily resin from a group of plants.  This resin is very sticky, so it easily attaches to your skin, clothing, tools equipment, and pet's fur.    This blistering rash is often called poison ivy rash although it can come from contact with the leaves, stems and roots of poison ivy, poison oak and poison sumac.  The oily resin contains urushiol (u-ROO-she-ol) that produces a skin rash on exposed skin.  The severity of the rash depends on the amount of urushiol that gets on your skin.  A section of skin with more urushiol on it may develop a rash sooner.  The rash usually develops 12-48 hours after exposure and can last two to three weeks.  Your skin must come in direct contact with the plant's oil to be affected.  Blister fluid doesn't spread the rash.  However, if you come into contact with a piece of clothing or pet fur that has urushiol on it, the rash may spread out.  You can also transfer the oil to other parts of your body with your fingers.  Often the rash looks like a straight line because of the way the plant brushes against your skin.  Since your rash is widespread or has resulted in a large number of blisters, I have prescribed an oral corticosteroid.  Please follow these recommendations:  I have sent a prednisone dose pack to your chosen pharmacy. Be sure to follow the instructions carefully and complete the entire prescription. You may use Benadryl or Caladryl topical lotions to sooth the itch and remember cool, not hot, showers and baths can help relieve the itching!  Place cool, wet compresses on the affected area for 15-30 minutes several times a day.  You may also take oral antihistamines, such as diphenhydramine (Benadryl, others), which may also help you sleep better.  Watch your skin for any purulent  (pus) drainage or red streaking from the site.  If this occurs, contact your provider.  You may require an antibiotic for a skin infection.  Make sure that the clothes you were wearing as well as any towels or sheets that may have come in contact with the oil (urushiol) are washed in detergent and hot water.       I have developed the following plan to treat your condition I am prescribing a two week course of steroids (37 tablets of 10 mg prednisone).  Days 1-4 take 4 tablets (40 mg) daily  Days 5-8 take 3 tablets (30 mg) daily, Days 9-11 take 2 tablets (20 mg) daily, Days 12-14 take 1 tablet (10 mg) daily.    What can you do to prevent this rash?  Avoid the plants.  Learn how to identify poison ivy, poison oak and poison sumac in all seasons.  When hiking or engaging in other activities that might expose you to these plants, try to stay on cleared pathways.  If camping, make sure you pitch your tent in an area free of these plants.  Keep pets from running through wooded areas so that urushiol doesn't accidentally stick to their fur, which you may touch.  Remove or kill the plants.  In your yard, you can get rid of poison ivy by applying an herbicide or pulling it out of   the ground, including the roots, while wearing heavy gloves.  Afterward remove the gloves and thoroughly wash them and your hands.  Don't burn poison ivy or related plants because the urushiol can be carried by smoke.  Wear protective clothing.  If needed, protect your skin by wearing socks, boots, pants, long sleeves and vinyl gloves.  Wash your skin right away.  Washing off the oil with soap and water within 30 minutes of exposure may reduce your chances of getting a poison ivy rash.  Even washing after an hour or so can help reduce the severity of the rash.  If you walk through some poison ivy and then later touch your shoes, you may get some urushiol on your hands, which may then transfer to your face or body by touching or  rubbing.  If the contaminated object isn't cleaned, the urushiol on it can still cause a skin reaction years later.    Be careful not to reuse towels after you have washed your skin.  Also carefully wash clothing in detergent and hot water to remove all traces of the oil.  Handle contaminated clothing carefully so you don't transfer the urushiol to yourself, furniture, rugs or appliances.  Remember that pets can carry the oil on their fur and paws.  If you think your pet may be contaminated with urushiol, put on some long rubber gloves and give your pet a bath.  Finally, be careful not to burn these plants as the smoke can contain traces of the oil.  Inhaling the smoke may result in difficulty breathing. If that occurred you should see a physician as soon as possible.  See your doctor right away if:  The reaction is severe or widespread You inhaled the smoke from burning poison ivy and are having difficulty breathing Your skin continues to swell The rash affects your eyes, mouth or genitals Blisters are oozing pus You develop a fever greater than 100 F (37.8 C) The rash doesn't get better within a few weeks.  If you scratch the poison ivy rash, bacteria under your fingernails may cause the skin to become infected.  See your doctor if pus starts oozing from the blisters.  Treatment generally includes antibiotics.  Poison ivy treatments are usually limited to self-care methods.  And the rash typically goes away on its own in two to three weeks.     If the rash is widespread or results in a large number of blisters, your doctor may prescribe an oral corticosteroid, such as prednisone.  If a bacterial infection has developed at the rash site, your doctor may give you a prescription for an oral antibiotic.  MAKE SURE YOU  Understand these instructions. Will watch your condition. Will get help right away if you are not doing well or get worse.   Thank you for choosing an e-visit.  Your  e-visit answers were reviewed by a board certified advanced clinical practitioner to complete your personal care plan. Depending upon the condition, your plan could have included both over the counter or prescription medications.  Please review your pharmacy choice. Make sure the pharmacy is open so you can pick up prescription now. If there is a problem, you may contact your provider through MyChart messaging and have the prescription routed to another pharmacy.  Your safety is important to us. If you have drug allergies check your prescription carefully.   For the next 24 hours you can use MyChart to ask questions about today's visit, request a non-urgent   call back, or ask for a work or school excuse. You will get an email in the next two days asking about your experience. I hope that your e-visit has been valuable and will speed your recovery.   I have spent 5 minutes in review of e-visit questionnaire, review and updating patient chart, medical decision making and response to patient.   Rosie Golson M Jamone Garrido, PA-C  

## 2023-03-30 ENCOUNTER — Telehealth: Payer: Medicaid Other | Admitting: Physician Assistant

## 2023-03-30 ENCOUNTER — Encounter: Payer: Self-pay | Admitting: Physician Assistant

## 2023-03-30 DIAGNOSIS — A084 Viral intestinal infection, unspecified: Secondary | ICD-10-CM | POA: Diagnosis not present

## 2023-03-30 MED ORDER — ONDANSETRON 4 MG PO TBDP
4.0000 mg | ORAL_TABLET | Freq: Three times a day (TID) | ORAL | 0 refills | Status: DC | PRN
Start: 2023-03-30 — End: 2023-10-09

## 2023-03-30 NOTE — Progress Notes (Signed)
I have spent 5 minutes in review of e-visit questionnaire, review and updating patient chart, medical decision making and response to patient.   William Cody Martin, PA-C    

## 2023-03-30 NOTE — Progress Notes (Signed)
E-Visit for Vomiting  We are sorry that you are not feeling well. Here is how we plan to help!  Based on what you have shared with me it looks like you have a Virus that is irritating your GI tract.  Vomiting is the forceful emptying of a portion of the stomach's content through the mouth.  Although nausea and vomiting can make you feel miserable, it's important to remember that these are not diseases, but rather symptoms of an underlying illness.  When we treat short term symptoms, we always caution that any symptoms that persist should be fully evaluated in a medical office.  I have prescribed a medication that will help alleviate your symptoms and allow you to stay hydrated:  Zofran 4 mg 1 tablet every 8 hours as needed for nausea and vomiting  HOME CARE: Drink clear liquids.  This is very important! Dehydration (the lack of fluid) can lead to a serious complication.  Start off with 1 tablespoon every 5 minutes for 8 hours. You may begin eating bland foods after 8 hours without vomiting.  Start with saltine crackers, white bread, rice, mashed potatoes, applesauce. After 48 hours on a bland diet, you may resume a normal diet. Try to go to sleep.  Sleep often empties the stomach and relieves the need to vomit.  GET HELP RIGHT AWAY IF:  Your symptoms do not improve or worsen within 2 days after treatment. You have a fever for over 3 days. You cannot keep down fluids after trying the medication.  MAKE SURE YOU:  Understand these instructions. Will watch your condition. Will get help right away if you are not doing well or get worse.   Thank you for choosing an e-visit.  Your e-visit answers were reviewed by a board certified advanced clinical practitioner to complete your personal care plan. Depending upon the condition, your plan could have included both over the counter or prescription medications.  Please review your pharmacy choice. Make sure the pharmacy is open so you can pick  up prescription now. If there is a problem, you may contact your provider through MyChart messaging and have the prescription routed to another pharmacy.  Your safety is important to us. If you have drug allergies check your prescription carefully.   For the next 24 hours you can use MyChart to ask questions about today's visit, request a non-urgent call back, or ask for a work or school excuse. You will get an email in the next two days asking about your experience. I hope that your e-visit has been valuable and will speed your recovery.  

## 2023-04-18 ENCOUNTER — Telehealth: Payer: Medicaid Other | Admitting: Family Medicine

## 2023-04-18 ENCOUNTER — Encounter: Payer: Self-pay | Admitting: Family Medicine

## 2023-04-18 DIAGNOSIS — R6889 Other general symptoms and signs: Secondary | ICD-10-CM

## 2023-04-18 NOTE — Progress Notes (Signed)
E visit for Flu like symptoms   We are sorry that you are not feeling well.  Here is how we plan to help! Based on what you have shared with me it looks like you may have flu-like symptoms that should be watched but do not seem to indicate anti-viral treatment.  Influenza or "the flu" is   an infection caused by a respiratory virus. The flu virus is highly contagious and persons who did not receive their yearly flu vaccination may "catch" the flu from close contact.  We have anti-viral medications to treat the viruses that cause this infection. They are not a "cure" and only shorten the course of the infection. These prescriptions are most effective when they are given within the first 2 days of "flu" symptoms. Antiviral medication are indicated if you have a high risk of complications from the flu. You should  also consider an antiviral medication if you are in close contact with someone who is at risk. These medications can help patients avoid complications from the flu  but have side effects that you should know. Possible side effects from Tamiflu or oseltamivir include nausea, vomiting, diarrhea, dizziness, headaches, eye redness, sleep problems or other respiratory symptoms. You should not take Tamiflu if you have an allergy to oseltamivir or any to the ingredients in Tamiflu.  Based upon your symptoms and potential risk factors I recommend that you follow the flu symptoms recommendation that I have listed below.  ANYONE WHO HAS FLU SYMPTOMS SHOULD: Stay home. The flu is highly contagious and going out or to work exposes others! Be sure to drink plenty of fluids. Water is fine as well as fruit juices, sodas and electrolyte beverages. You may want to stay away from caffeine or alcohol. If you are nauseated, try taking small sips of liquids. How do you know if you are getting enough fluid? Your urine should be a pale yellow or almost colorless. Get rest. Taking a steamy shower or using a  humidifier may help nasal congestion and ease sore throat pain. Using a saline nasal spray works much the same way. Cough drops, hard candies and sore throat lozenges may ease your cough. Line up a caregiver. Have someone check on you regularly.   GET HELP RIGHT AWAY IF: You cannot keep down liquids or your medications. You become short of breath Your fell like you are going to pass out or loose consciousness. Your symptoms persist after you have completed your treatment plan MAKE SURE YOU  Understand these instructions. Will watch your condition. Will get help right away if you are not doing well or get worse.  Your e-visit answers were reviewed by a board certified advanced clinical practitioner to complete your personal care plan.  Depending on the condition, your plan could have included both over the counter or prescription medications.  If there is a problem please reply  once you have received a response from your provider.  Your safety is important to [place].  If you have drug allergies check your prescription carefully.    You can use MyChart to ask questions about today's visit, request a non-urgent call back, or ask for a work or school excuse for 24 hours related to this e-Visit. If it has been greater than 24 hours you will need to follow up with your provider, or enter a new e-Visit to address those concerns.  You will get an e-mail in the next two days asking about your experience.  I hope that  your e-visit has been valuable and will speed your recovery. Thank you for using e-visits.  I provided 5 minutes of non face-to-face time during this encounter for chart review, medication and order placement, as well as and documentation.

## 2023-10-04 ENCOUNTER — Telehealth: Admitting: Physician Assistant

## 2023-10-04 DIAGNOSIS — J208 Acute bronchitis due to other specified organisms: Secondary | ICD-10-CM

## 2023-10-04 MED ORDER — BENZONATATE 100 MG PO CAPS
100.0000 mg | ORAL_CAPSULE | Freq: Three times a day (TID) | ORAL | 0 refills | Status: AC | PRN
Start: 2023-10-04 — End: ?

## 2023-10-04 MED ORDER — ALBUTEROL SULFATE HFA 108 (90 BASE) MCG/ACT IN AERS
1.0000 | INHALATION_SPRAY | Freq: Four times a day (QID) | RESPIRATORY_TRACT | 0 refills | Status: AC | PRN
Start: 2023-10-04 — End: ?

## 2023-10-04 NOTE — Progress Notes (Signed)
 E-Visit for Cough   We are sorry that you are not feeling well.  Here is how we plan to help!  Based on your presentation I believe you most likely have A cough due to a virus.  This is called viral bronchitis and is best treated by rest, plenty of fluids and control of the cough.  You may use Ibuprofen or Tylenol as directed to help your symptoms.     In addition you may use A non-prescription cough medication called Mucinex DM: take 2 tablets every 12 hours. and A prescription cough medication called Tessalon Perles 100mg . You may take 1-2 capsules every 8 hours as needed for your cough.  I have prescribed Albuterol inhaler Use 1-2 puffs every 6 hours as needed for shortness of breath, chest tightness, and/or wheezing.  From your responses in the eVisit questionnaire you describe inflammation in the upper respiratory tract which is causing a significant cough.  This is commonly called Bronchitis and has four common causes:   Allergies Viral Infections Acid Reflux Bacterial Infection Allergies, viruses and acid reflux are treated by controlling symptoms or eliminating the cause. An example might be a cough caused by taking certain blood pressure medications. You stop the cough by changing the medication. Another example might be a cough caused by acid reflux. Controlling the reflux helps control the cough.  USE OF BRONCHODILATOR ("RESCUE") INHALERS: There is a risk from using your bronchodilator too frequently.  The risk is that over-reliance on a medication which only relaxes the muscles surrounding the breathing tubes can reduce the effectiveness of medications prescribed to reduce swelling and congestion of the tubes themselves.  Although you feel brief relief from the bronchodilator inhaler, your asthma may actually be worsening with the tubes becoming more swollen and filled with mucus.  This can delay other crucial treatments, such as oral steroid medications. If you need to use a  bronchodilator inhaler daily, several times per day, you should discuss this with your provider.  There are probably better treatments that could be used to keep your asthma under control.     HOME CARE Only take medications as instructed by your medical team. Complete the entire course of an antibiotic. Drink plenty of fluids and get plenty of rest. Avoid close contacts especially the very young and the elderly Cover your mouth if you cough or cough into your sleeve. Always remember to wash your hands A steam or ultrasonic humidifier can help congestion.   GET HELP RIGHT AWAY IF: You develop worsening fever. You become short of breath You cough up blood. Your symptoms persist after you have completed your treatment plan MAKE SURE YOU  Understand these instructions. Will watch your condition. Will get help right away if you are not doing well or get worse.    Thank you for choosing an e-visit.  Your e-visit answers were reviewed by a board certified advanced clinical practitioner to complete your personal care plan. Depending upon the condition, your plan could have included both over the counter or prescription medications.  Please review your pharmacy choice. Make sure the pharmacy is open so you can pick up prescription now. If there is a problem, you may contact your provider through [employer] and have the prescription routed to another pharmacy.  Your safety is important to [place]. If you have drug allergies check your prescription carefully.   For the next 24 hours you can use MyChart to ask questions about today's visit, request a non-urgent call back,  or ask for a work or school excuse. You will get an email in the next two days asking about your experience. I hope that your e-visit has been valuable and will speed your recovery.   I have spent 5 minutes in review of e-visit questionnaire, review and updating patient chart, medical decision making and response to patient.    Margaretann Loveless, PA-C

## 2023-10-09 ENCOUNTER — Telehealth: Admitting: Physician Assistant

## 2023-10-09 DIAGNOSIS — R112 Nausea with vomiting, unspecified: Secondary | ICD-10-CM

## 2023-10-09 MED ORDER — ONDANSETRON HCL 4 MG PO TABS: 4.0000 mg | ORAL_TABLET | Freq: Three times a day (TID) | ORAL | 0 refills | Status: AC | PRN

## 2023-10-09 NOTE — Progress Notes (Signed)

## 2024-07-19 ENCOUNTER — Telehealth: Admitting: Family Medicine

## 2024-07-19 DIAGNOSIS — J208 Acute bronchitis due to other specified organisms: Secondary | ICD-10-CM | POA: Diagnosis not present

## 2024-07-19 MED ORDER — ALBUTEROL SULFATE (2.5 MG/3ML) 0.083% IN NEBU: 2.5000 mg | INHALATION_SOLUTION | Freq: Four times a day (QID) | RESPIRATORY_TRACT | 0 refills | Status: AC | PRN

## 2024-07-19 MED ORDER — BENZONATATE 200 MG PO CAPS: 200.0000 mg | ORAL_CAPSULE | Freq: Two times a day (BID) | ORAL | 0 refills | Status: AC | PRN

## 2024-07-19 MED ORDER — ALBUTEROL SULFATE HFA 108 (90 BASE) MCG/ACT IN AERS: 1.0000 | INHALATION_SPRAY | Freq: Four times a day (QID) | RESPIRATORY_TRACT | 0 refills | Status: AC | PRN

## 2024-07-19 NOTE — Progress Notes (Signed)
 We are sorry that you are not feeling well.  Here is how we plan to help!  Based on your presentation I believe you most likely have A cough due to a virus.  This is called viral bronchitis and is best treated by rest, plenty of fluids and control of the cough.  You may use Ibuprofen  or Tylenol  as directed to help your symptoms.     In addition you may use A prescription cough medication called Tessalon  Perles 100mg . You may take 1-2 capsules every 8 hours as needed for your cough. I have also sent an albuterol  inhaler and neb.   From your responses in the eVisit questionnaire you describe inflammation in the upper respiratory tract which is causing a significant cough.  This is commonly called Bronchitis and has four common causes:   Allergies Viral Infections Acid Reflux Bacterial Infection Allergies, viruses and acid reflux are treated by controlling symptoms or eliminating the cause. An example might be a cough caused by taking certain blood pressure medications. You stop the cough by changing the medication. Another example might be a cough caused by acid reflux. Controlling the reflux helps control the cough.  USE OF BRONCHODILATOR (RESCUE) INHALERS: There is a risk from using your bronchodilator too frequently.  The risk is that over-reliance on a medication which only relaxes the muscles surrounding the breathing tubes can reduce the effectiveness of medications prescribed to reduce swelling and congestion of the tubes themselves.  Although you feel brief relief from the bronchodilator inhaler, your asthma may actually be worsening with the tubes becoming more swollen and filled with mucus.  This can delay other crucial treatments, such as oral steroid medications. If you need to use a bronchodilator inhaler daily, several times per day, you should discuss this with your provider.  There are probably better treatments that could be used to keep your asthma under control.     HOME  CARE Only take medications as instructed by your medical team. Complete the entire course of an antibiotic. Drink plenty of fluids and get plenty of rest. Avoid close contacts especially the very young and the elderly Cover your mouth if you cough or cough into your sleeve. Always remember to wash your hands A steam or ultrasonic humidifier can help congestion.   GET HELP RIGHT AWAY IF: You develop worsening fever. You become short of breath You cough up blood. Your symptoms persist after you have completed your treatment plan MAKE SURE YOU  Understand these instructions. Will watch your condition. Will get help right away if you are not doing well or get worse.  Your e-visit answers were reviewed by a board certified advanced clinical practitioner to complete your personal care plan.  Depending on the condition, your plan could have included both over the counter or prescription medications. If there is a problem please reply  once you have received a response from your provider. Your safety is important to us .  If you have drug allergies check your prescription carefully.    You can use MyChart to ask questions about todays visit, request a non-urgent call back, or ask for a work or school excuse for 24 hours related to this e-Visit. If it has been greater than 24 hours you will need to follow up with your provider, or enter a new e-Visit to address those concerns. You will get an e-mail in the next two days asking about your experience.  I hope that your e-visit has been valuable and will  speed your recovery. Thank you for using e-visits.   I have spent 5 minutes in review of e-visit questionnaire, review and updating patient chart, medical decision making and response to patient.   Dannell Gortney, FNP
